# Patient Record
Sex: Male | Born: 1940 | Race: White | Hispanic: No | Marital: Married | Smoking: Current some day smoker
Health system: Southern US, Community
[De-identification: ages and names within clinical notes are randomized; demographics above are authoritative.]

## PROBLEM LIST (undated history)

## (undated) DIAGNOSIS — I214 Non-ST elevation (NSTEMI) myocardial infarction: Secondary | ICD-10-CM

## (undated) DIAGNOSIS — I5022 Chronic systolic (congestive) heart failure: Secondary | ICD-10-CM

## (undated) DIAGNOSIS — K219 Gastro-esophageal reflux disease without esophagitis: Secondary | ICD-10-CM

## (undated) DIAGNOSIS — I255 Ischemic cardiomyopathy: Secondary | ICD-10-CM

## (undated) DIAGNOSIS — I2510 Atherosclerotic heart disease of native coronary artery without angina pectoris: Secondary | ICD-10-CM

## (undated) DIAGNOSIS — K5790 Diverticulosis of intestine, part unspecified, without perforation or abscess without bleeding: Secondary | ICD-10-CM

## (undated) DIAGNOSIS — I10 Essential (primary) hypertension: Secondary | ICD-10-CM

## (undated) DIAGNOSIS — E785 Hyperlipidemia, unspecified: Secondary | ICD-10-CM

## (undated) DIAGNOSIS — I441 Atrioventricular block, second degree: Secondary | ICD-10-CM

## (undated) DIAGNOSIS — J449 Chronic obstructive pulmonary disease, unspecified: Secondary | ICD-10-CM

## (undated) DIAGNOSIS — E669 Obesity, unspecified: Secondary | ICD-10-CM

## (undated) DIAGNOSIS — G4733 Obstructive sleep apnea (adult) (pediatric): Secondary | ICD-10-CM

## (undated) DIAGNOSIS — I4892 Unspecified atrial flutter: Secondary | ICD-10-CM

## (undated) DIAGNOSIS — M1990 Unspecified osteoarthritis, unspecified site: Secondary | ICD-10-CM

## (undated) HISTORY — DX: Chronic systolic (congestive) heart failure: I50.22

## (undated) HISTORY — PX: APPENDECTOMY: SHX54

## (undated) HISTORY — PX: TONSILLECTOMY: SUR1361

## (undated) HISTORY — PX: KNEE ARTHROSCOPY: SUR90

## (undated) HISTORY — DX: Unspecified atrial flutter: I48.92

## (undated) HISTORY — DX: Ischemic cardiomyopathy: I25.5

---

## 2003-11-13 ENCOUNTER — Encounter: Admission: RE | Admit: 2003-11-13 | Discharge: 2003-11-13 | Payer: Self-pay | Admitting: Family Medicine

## 2004-05-02 ENCOUNTER — Ambulatory Visit (HOSPITAL_COMMUNITY): Admission: RE | Admit: 2004-05-02 | Discharge: 2004-05-02 | Payer: Self-pay | Admitting: Sports Medicine

## 2004-11-29 ENCOUNTER — Ambulatory Visit: Payer: Self-pay | Admitting: Internal Medicine

## 2004-12-05 ENCOUNTER — Ambulatory Visit: Payer: Self-pay | Admitting: Cardiology

## 2006-12-28 ENCOUNTER — Ambulatory Visit: Payer: Self-pay | Admitting: Internal Medicine

## 2007-01-09 ENCOUNTER — Encounter: Payer: Self-pay | Admitting: Internal Medicine

## 2007-01-09 ENCOUNTER — Ambulatory Visit: Payer: Self-pay | Admitting: Internal Medicine

## 2007-01-09 DIAGNOSIS — D126 Benign neoplasm of colon, unspecified: Secondary | ICD-10-CM | POA: Insufficient documentation

## 2007-01-15 ENCOUNTER — Ambulatory Visit: Payer: Self-pay | Admitting: Internal Medicine

## 2007-01-15 LAB — CONVERTED CEMR LAB
HCT: 43.3 % (ref 39.0–52.0)
Hemoglobin: 14.8 g/dL (ref 13.0–17.0)
Monocytes Relative: 10.1 % (ref 3.0–11.0)
WBC: 6.6 10*3/uL (ref 4.5–10.5)

## 2007-01-16 ENCOUNTER — Ambulatory Visit: Payer: Self-pay | Admitting: Internal Medicine

## 2007-05-16 DIAGNOSIS — I214 Non-ST elevation (NSTEMI) myocardial infarction: Secondary | ICD-10-CM

## 2007-05-16 HISTORY — DX: Non-ST elevation (NSTEMI) myocardial infarction: I21.4

## 2007-08-08 DIAGNOSIS — K648 Other hemorrhoids: Secondary | ICD-10-CM | POA: Insufficient documentation

## 2007-08-08 DIAGNOSIS — K5730 Diverticulosis of large intestine without perforation or abscess without bleeding: Secondary | ICD-10-CM | POA: Insufficient documentation

## 2007-11-05 ENCOUNTER — Ambulatory Visit: Payer: Self-pay | Admitting: Cardiovascular Disease

## 2007-11-05 ENCOUNTER — Inpatient Hospital Stay (HOSPITAL_COMMUNITY): Admission: EM | Admit: 2007-11-05 | Discharge: 2007-11-07 | Payer: Self-pay | Admitting: Emergency Medicine

## 2007-11-06 ENCOUNTER — Encounter: Payer: Self-pay | Admitting: Cardiovascular Disease

## 2007-12-05 ENCOUNTER — Encounter (HOSPITAL_COMMUNITY): Admission: RE | Admit: 2007-12-05 | Discharge: 2008-03-04 | Payer: Self-pay | Admitting: Cardiovascular Disease

## 2007-12-09 ENCOUNTER — Ambulatory Visit: Payer: Self-pay | Admitting: Cardiovascular Disease

## 2007-12-09 ENCOUNTER — Ambulatory Visit: Payer: Self-pay | Admitting: Cardiology

## 2008-01-07 ENCOUNTER — Ambulatory Visit: Payer: Self-pay | Admitting: Cardiovascular Disease

## 2008-01-07 ENCOUNTER — Ambulatory Visit: Payer: Self-pay

## 2008-01-09 ENCOUNTER — Ambulatory Visit: Payer: Self-pay

## 2008-01-09 ENCOUNTER — Ambulatory Visit: Payer: Self-pay | Admitting: Cardiovascular Disease

## 2008-03-04 ENCOUNTER — Ambulatory Visit: Payer: Self-pay | Admitting: Cardiology

## 2008-06-15 DIAGNOSIS — E663 Overweight: Secondary | ICD-10-CM | POA: Insufficient documentation

## 2008-06-15 DIAGNOSIS — M1710 Unilateral primary osteoarthritis, unspecified knee: Secondary | ICD-10-CM

## 2008-06-15 DIAGNOSIS — E785 Hyperlipidemia, unspecified: Secondary | ICD-10-CM | POA: Insufficient documentation

## 2008-06-15 DIAGNOSIS — I10 Essential (primary) hypertension: Secondary | ICD-10-CM | POA: Insufficient documentation

## 2008-06-16 ENCOUNTER — Ambulatory Visit: Payer: Self-pay | Admitting: Cardiovascular Disease

## 2008-06-24 ENCOUNTER — Ambulatory Visit: Payer: Self-pay | Admitting: Cardiology

## 2008-08-19 ENCOUNTER — Encounter: Payer: Self-pay | Admitting: Cardiovascular Disease

## 2008-08-19 ENCOUNTER — Ambulatory Visit: Payer: Self-pay | Admitting: Cardiovascular Disease

## 2008-08-19 DIAGNOSIS — J209 Acute bronchitis, unspecified: Secondary | ICD-10-CM | POA: Insufficient documentation

## 2008-11-04 ENCOUNTER — Ambulatory Visit: Payer: Self-pay | Admitting: Cardiology

## 2008-12-17 ENCOUNTER — Encounter: Payer: Self-pay | Admitting: Cardiovascular Disease

## 2008-12-29 ENCOUNTER — Telehealth: Payer: Self-pay | Admitting: Cardiovascular Disease

## 2008-12-30 ENCOUNTER — Encounter: Payer: Self-pay | Admitting: *Deleted

## 2008-12-30 ENCOUNTER — Telehealth (INDEPENDENT_AMBULATORY_CARE_PROVIDER_SITE_OTHER): Payer: Self-pay | Admitting: *Deleted

## 2008-12-31 ENCOUNTER — Telehealth (INDEPENDENT_AMBULATORY_CARE_PROVIDER_SITE_OTHER): Payer: Self-pay

## 2008-12-31 ENCOUNTER — Encounter: Payer: Self-pay | Admitting: Cardiovascular Disease

## 2008-12-31 ENCOUNTER — Ambulatory Visit: Payer: Self-pay

## 2009-01-07 ENCOUNTER — Ambulatory Visit: Payer: Self-pay | Admitting: Cardiovascular Disease

## 2009-01-07 LAB — CONVERTED CEMR LAB
BUN: 11 mg/dL (ref 6–23)
CO2: 32 meq/L (ref 19–32)
Calcium: 8.6 mg/dL (ref 8.4–10.5)
Creatinine, Ser: 0.9 mg/dL (ref 0.4–1.5)
GFR calc non Af Amer: 88.73 mL/min (ref >60)
Glucose, Bld: 102 mg/dL — ABNORMAL HIGH (ref 70–99)
Potassium: 4.2 meq/L (ref 3.5–5.1)

## 2009-01-12 ENCOUNTER — Ambulatory Visit: Payer: Self-pay | Admitting: Cardiovascular Disease

## 2009-04-16 ENCOUNTER — Encounter (INDEPENDENT_AMBULATORY_CARE_PROVIDER_SITE_OTHER): Payer: Self-pay | Admitting: *Deleted

## 2009-04-30 ENCOUNTER — Ambulatory Visit: Payer: Self-pay | Admitting: Cardiology

## 2009-04-30 ENCOUNTER — Encounter: Payer: Self-pay | Admitting: Cardiovascular Disease

## 2009-06-09 ENCOUNTER — Ambulatory Visit: Payer: Self-pay | Admitting: Cardiology

## 2009-06-22 ENCOUNTER — Ambulatory Visit: Payer: Self-pay | Admitting: Cardiology

## 2009-07-06 ENCOUNTER — Ambulatory Visit: Payer: Self-pay | Admitting: Cardiovascular Disease

## 2009-08-10 ENCOUNTER — Encounter (INDEPENDENT_AMBULATORY_CARE_PROVIDER_SITE_OTHER): Payer: Self-pay | Admitting: *Deleted

## 2009-08-24 ENCOUNTER — Ambulatory Visit: Payer: Self-pay | Admitting: Cardiovascular Disease

## 2009-08-24 ENCOUNTER — Encounter: Payer: Self-pay | Admitting: Cardiovascular Disease

## 2009-08-26 ENCOUNTER — Ambulatory Visit: Payer: Self-pay | Admitting: Cardiovascular Disease

## 2009-08-26 ENCOUNTER — Encounter (INDEPENDENT_AMBULATORY_CARE_PROVIDER_SITE_OTHER): Payer: Self-pay | Admitting: *Deleted

## 2009-08-26 ENCOUNTER — Telehealth (INDEPENDENT_AMBULATORY_CARE_PROVIDER_SITE_OTHER): Payer: Self-pay | Admitting: *Deleted

## 2009-09-29 ENCOUNTER — Encounter: Payer: Self-pay | Admitting: Cardiovascular Disease

## 2009-09-29 ENCOUNTER — Ambulatory Visit: Payer: Self-pay | Admitting: Cardiovascular Disease

## 2009-10-14 ENCOUNTER — Encounter: Payer: Self-pay | Admitting: Cardiovascular Disease

## 2009-11-01 ENCOUNTER — Encounter (INDEPENDENT_AMBULATORY_CARE_PROVIDER_SITE_OTHER): Payer: Self-pay | Admitting: *Deleted

## 2009-11-25 ENCOUNTER — Encounter: Payer: Self-pay | Admitting: Internal Medicine

## 2009-12-14 ENCOUNTER — Ambulatory Visit: Payer: Self-pay | Admitting: Internal Medicine

## 2009-12-14 DIAGNOSIS — R0602 Shortness of breath: Secondary | ICD-10-CM

## 2009-12-14 DIAGNOSIS — G4730 Sleep apnea, unspecified: Secondary | ICD-10-CM | POA: Insufficient documentation

## 2009-12-14 DIAGNOSIS — R05 Cough: Secondary | ICD-10-CM

## 2009-12-21 ENCOUNTER — Encounter: Payer: Self-pay | Admitting: Cardiovascular Disease

## 2009-12-30 ENCOUNTER — Ambulatory Visit: Payer: Self-pay | Admitting: Internal Medicine

## 2010-01-06 ENCOUNTER — Ambulatory Visit: Payer: Self-pay | Admitting: Cardiovascular Disease

## 2010-03-02 ENCOUNTER — Encounter: Payer: Self-pay | Admitting: Internal Medicine

## 2010-03-02 ENCOUNTER — Ambulatory Visit: Payer: Self-pay | Admitting: Internal Medicine

## 2010-03-02 DIAGNOSIS — J449 Chronic obstructive pulmonary disease, unspecified: Secondary | ICD-10-CM | POA: Insufficient documentation

## 2010-03-02 DIAGNOSIS — J4489 Other specified chronic obstructive pulmonary disease: Secondary | ICD-10-CM | POA: Insufficient documentation

## 2010-03-31 ENCOUNTER — Encounter: Payer: Self-pay | Admitting: Cardiovascular Disease

## 2010-03-31 ENCOUNTER — Encounter: Payer: Self-pay | Admitting: Internal Medicine

## 2010-05-02 ENCOUNTER — Encounter: Payer: Self-pay | Admitting: Internal Medicine

## 2010-05-02 ENCOUNTER — Ambulatory Visit: Payer: Self-pay | Admitting: Internal Medicine

## 2010-06-14 NOTE — Letter (Signed)
Summary: [facility]  [facility]   Imported By: Lester [place] Rose 12/20/2009 09:42:04  _____________________________________________________________________  External Attachment:    Type:   Image     Comment:   External Document

## 2010-06-14 NOTE — Assessment & Plan Note (Signed)
Summary: pulm cons/klw   Visit Type:  Initial Consult Copy to:  Dr. Felipa Eth Primary Provider/Referring Provider:  Dr. Felipa Eth - Primary, Dr. Eden Emms - Cards,   CC:  Pulmnary Consult for SOB.  History of Present Illness: IOV 2010-01-05  70 year old pipe-smoker, BMI 38, known CAD - s/p stent in 2009. PResents to pulmonary office with c/o dyspnea. Also c/o mucus in throat.   #dyspnea - insidious onset 6-8 months ago. Denies dyspnea prior to that even during MI period except briefly. Stabe since onset. Rates dyspnea is as severe. Dyspnea aggravated by 'mucus accumulation in throat' and when he is in his condo.  Dyspnea was relieved by one dose of steroids in Dr. Vicente Males office recently. Sporadic use  of samples of symbicort seemed to have helped recently. Proventil inhaler appears to have helped "a little bit". Also, he states that a visit to beaches in June 2011 and [place], [place] in July  helped with dyspnea and . He is concerned about familial issues with dyspna becuase both his dad and grandad had 'lung problems'.  Dyspnea is associated with chest tightness and occ wheeze. STress test 1 year ago per his hx was normal although I cannot find it in [employer].   #Cough - also reports a cough. It is associated with mucus in throat. PResent for 1 year. Mucus is usually clear but very thick. He feels it is a post nasal drainage. He does not think mucus is coming from chest.   Denies chest pain,  hemoptysis, edema, orthopnea, paroxysmal nocturnal dyspnea, fever, chills  Preventive Screening-Counseling & Management  Alcohol-Tobacco     Smoking Status: quit < 6 months     Year Started: 1961     Year Quit: 2011  Comments: continuous pipe smoker x 40 years. Quit 2011.   Current Medications (verified): 1)  Metoprolol Tartrate 25 Mg Tabs (Metoprolol Tartrate) .... Take One Tablet By Mouth Twice A Day 2)  Multivitamins   Tabs (Multiple Vitamin) .Marland Kitchen.. 1 Tab By Mouth Once Daily 3)  Co Q-10 75 Mg Caps  (Coenzyme Q10) .Marland Kitchen.. 1 Tab By Mouth Once Daily 4)  Vitamin D 2000 Unit Tabs (Cholecalciferol) .Marland Kitchen.. 1 Tab By Mouth Once Daily 5)  Crestor 10 Mg Tabs (Rosuvastatin Calcium) .... At Bedtime 6)  Aspirin 81 Mg Chw Tab (Aspirin) .... Take One (1) Tablet By Mouth Daily  Allergies (verified): No Known Drug Allergies  Past History:  Past Surgical History: Last updated: 06/15/2008 Appendectomy Tonsillectomy Right knee surgery heart cath 11/06/07 s/p drug eluting stent to the left ant. descending 6/09  Family History: Last updated: 2010/01/05 Father:decease, allergies and asthma Grandfather: died of lung problems Mother:decease Siblings:sister diabetes  Social History: Last updated: 01-05-2010 Retired self employed running greenhouse Married Drug Use - no 2 children Tobacco Use - Former pipe smoker x 40 yrs.  Quit in 2011. 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  Quit in 2011. alcohol 3-6 drinks/month Smoking Status:  quit < 6 months  Review of Systems       The patient complains of shortness of breath with activity, productive cough, non-productive cough, difficulty swallowing, sore throat, tooth/dental problems, nasal congestion/difficulty breathing through nose, hand/feet swelling, and joint stiffness or pain.  The patient denies shortness of breath at rest, chest  pain, irregular heartbeats, acid heartburn, indigestion, loss of appetite, weight change, abdominal pain, headaches, sneezing, itching, ear ache, anxiety, depression, rash, change in color of mucus, and fever.    Vital Signs:  Patient profile:   70 year old male Height:      74 inches Weight:      295.25 pounds BMI:     38.04 O2 Sat:      93 % on Room air Temp:     95.9 degrees F oral Pulse rate:   68 / minute BP sitting:   146 / 88  (left arm) Cuff size:   large  Vitals Entered By: Gweneth Dimitri RN (December 14, 2009 3:31 PM)  O2 Flow:  Room air  Serial Vital Signs/Assessments:  Comments: Ambulatory Pulse Oximetry  Resting; HR_55____    02 Sat_____  Lap1 (185 feet)   HR_72____   02 Sat__95%RA___ Lap2 (185 feet)   HR_75____   02 Sat__93%RA___    Lap3 (185 feet)   HR_74____   02 Sat__95%RA___  _X__Test Completed without Difficulty ___Test Stopped due to:  Carver Fila  December 14, 2009 4:36 PM  By: Carver Fila   CC: Pulmnary Consult for SOB Comments Medications reviewed with patient Daytime contact number verified with patient. Gweneth Dimitri RN  December 14, 2009 3:31 PM    Physical Exam  General:  GEN: Obese Affect appropriate Healthy:  appears stated age HEENT: normal Neck supple with no adenopathy JVP normal no bruits no thyromegaly Lungs clear with no wheezing and good diaphragmatic motion Heart:  S1/S2 no murmur,rub, gallop or click PMI normal Abdomen: benighn, BS positve, no tenderness, no AAA no bruit.  No HSM or HJR Distal pulses intact with no bruits No edema Neuro non-focal Skin warm and dry    CXR  Procedure date:  11/05/2007  Findings:      Service Report Text  [facility] Accession Number: [id number]      Clinical Data: Chest pain.    CHEST - 2 VIEW    Comparison: None    Findings: The heart is borderline in size.  There is a left   ventricular configuration.  Low lung volumes with vascular crowding   and streaky atelectasis.  Some of this  may be chronic scarring   change.  No infiltrates, edema or effusions.  Mild peribronchial   thickening is noted.  This could be related to smoking.  The bony   thorax is intact.    IMPRESSION:    1.  Low volume chest film with vascular crowding and streaky   atelectasis.   2.  Bronchitic changes, likely related to smoking.   3.  No infiltrates, edema or effusions.    Read By:  Cyndie Chime,  M.D.   Released By:  Cyndie Chime,  M.D.  Comments:      independently reviewed  Echocardiogram  Procedure date:  11/06/2007  Findings:      SUMMARY   -  The left ventricle was small. Overall left ventricular systolic         function was normal. Left ventricular ejection fraction was  estimated to be 55 %. Left ventricular wall thickness was         mildly to moderately increased.   -  The aortic valve was mildly calcified.    IMPRESSIONS   -  There was no echocardiographic evidence for a cardiac source of         embolism.   Impression & Recommendations:  Problem # 1:  DYSPNEA (ICD-786.05) Assessment New Need to rule out COPD. Underlying Obesity and CAD might be playing a role but first will get PFT and decide next step Orders: Pulmonary Referral (Pulmonary) Consultation Level V (45409)  Problem # 2:  COUGH (ICD-786.2) Assessment: New Probably due to sinus drainage. Associated chronic bronchitis/copd from smoking might also be playing a role. WE will start with netti pot saline wash onregular basis and see how this helps him.  Orders: Pulmonary Referral (Pulmonary) Consultation Level V [phone])  Medications Added to Medication List This Visit: 1)  Crestor 10 Mg Tabs (Rosuvastatin calcium) .... At bedtime  Patient Instructions: 1)  use netti pot saline wash daily 2)  my nurse will walk you to test for oxygen 3)  have full breathing test called PFT and return for followup   Immunization History:  Pneumovax Immunization History:    Pneumovax:   historical (05/15/2008)

## 2010-06-14 NOTE — Assessment & Plan Note (Signed)
Summary: rov 2 month///kp   Visit Type:  Follow-up Copy to:  Dr. Felipa Eth Primary Provider/Referring Provider:  Dr. Felipa Eth - Primary, Dr. Eden Emms - Cards,   CC:  Pt here for followup. Pt states breathing has been doing well but he has recently been feeling some increased congestion and PND. Marland Kitchen  History of Present Illness: IOV 12/14/2009  70 year old ex- pipe-smoker, BMI 38, known CAD - s/p stent in 2009. PResents to pulmonary office with c/o dyspnea. Also c/o mucus in throat.   #dyspnea - insidious onset 6-8 months ago. Denies dyspnea prior to that even during MI period except briefly. Stabe since onset. Rates dyspnea is as severe. Dyspnea aggravated by 'mucus accumulation in throat' and when he is in his condo.  Dyspnea was relieved by one dose of steroids in Dr. Vicente Males office recently. Sporadic use  of samples of symbicort seemed to have helped recently. Proventil inhaler appears to have helped "a little bit". Also, he states that a visit to beaches in June 2011 and [place], [place] in July  helped with dyspnea and . He is concerned about familial issues with dyspna becuase both his dad and grandad had 'lung problems'.  Dyspnea is associated with chest tightness and occ wheeze. STress test 1 year ago per his hx was normal although I cannot find it in [employer].   #Cough - also reports a cough. It is associated with mucus in throat. PResent for 1 year. Mucus is usually clear but very thick. He feels it is a post nasal drainage. He does not think mucus is coming from chest.   REC: 1. PFT  2. Walking desat test -> Did not desaturate 3. Netti Pot saline wash    OV December 30, 2009: some relief with netti pot saline wash. Otherwise no change in symptoms since last visit. No new symptoms. Here to review PFT results. PFTs today show Moderate Obstruction (Fev1 1.91L/58%,  wtih 320cc/10%  BD response in FVC, normal lung volume TLC  6.6/88%, and normal DLCO 24/80%. This fits in with asthma. REC: START  SYMBICORT and use NETTI POT REGULARLY.   March 02, 2010:Dyspnea somewhat better after starting symbicort. Past week sinus congestion +, sick contacts +. No wheeze. No coug. No  sputum. Does not want antibiotics. No fever. No edema. No sputum. No syncope. Cleda Daub today shows fev1 1.96L/51% and unchanged from last visit despite reported compliance with symbicort. REfusing flu shot   Preventive Screening-Counseling & Management  Alcohol-Tobacco     Smoking Status: quit < 6 months     Smoke Cessation Stage: quit     Year Started: 1961     Year Quit: 2011     Tobacco Counseling: not to resume use of tobacco products  Current Medications (verified): 1)  Metoprolol Tartrate 25 Mg Tabs (Metoprolol Tartrate) .... Take One Tablet By Mouth Twice A Day 2)  Multivitamins   Tabs (Multiple Vitamin) .Marland Kitchen.. 1 Tab By Mouth Once Daily 3)  Co Q-10 75 Mg Caps (Coenzyme Q10) .Marland Kitchen.. 1 Tab By Mouth Once Daily 4)  Vitamin D 2000 Unit Tabs (Cholecalciferol) .Marland Kitchen.. 1 Tab By Mouth Once Daily 5)  Crestor 10 Mg Tabs (Rosuvastatin Calcium) .... At Bedtime 6)  Aspirin 81 Mg Chw Tab (Aspirin) .... Take One (1) Tablet By Mouth Daily 7)  Symbicort 160-4.5 Mcg/act Aero (Budesonide-Formoterol Fumarate) .... 2 Puffs Twicw Daily 8)  Proair Hfa 108 (90 Base) Mcg/act Aers (Albuterol Sulfate) .... As Needed  Allergies (verified): No Known Drug  Allergies  Past History:  Past medical, surgical, family and social histories (including risk factors) reviewed, and no changes noted (except as noted below).  Past Medical History: Reviewed history from 12/14/2009 and no changes required. Current Problems:  HEMORRHOIDS, INTERNAL (ICD-455.0) DIVERTICULOSIS, COLON (ICD-562.10) COLONIC POLYPS, ADENOMATOUS (ICD-211.3) CAD  >PCI of the lesion in the proximal LAD using a PROMUS         drug-eluting stent 6.24.09 Diabetes Type 2 Hyperlipidemia Hypertension Asthma  Past Surgical History: Reviewed history from 06/15/2008 and no changes  required. Appendectomy Tonsillectomy Right knee surgery heart cath 11/06/07 s/p drug eluting stent to the left ant. descending 6/09  Family History: Reviewed history from 12/14/2009 and no changes required. Father:decease, allergies and asthma Grandfather: died of lung problems Mother:decease Siblings:sister diabetes  Social History: Reviewed history from 12/14/2009 and no changes required. Retired self employed [employer] Married Drug Use - no 2 children Tobacco Use - Former pipe smoker x 40 yrs.  Quit in 2011. alcohol 3-6 drinks/month  Review of Systems       The patient complains of shortness of breath with activity, productive cough, and nasal congestion/difficulty breathing through nose.  The patient denies shortness of breath at rest, non-productive cough, coughing up blood, chest pain, irregular heartbeats, acid heartburn, indigestion, loss of appetite, weight change, abdominal pain, difficulty swallowing, sore throat, tooth/dental problems, headaches, sneezing, itching, ear ache, anxiety, depression, hand/feet swelling, joint stiffness or pain, rash, change in color of mucus, and fever.    Vital Signs:  Patient profile:   70 year old male Height:      74 inches Weight:      305.50 pounds BMI:     39.37 O2 Sat:      88 % on Room air Temp:     98.2 degrees F oral Pulse rate:   86 / minute BP sitting:   128 / 76  (right arm) Cuff size:   large  Vitals Entered By: Carron Curie CMA (March 02, 2010 3:32 PM)  O2 Flow:  Room air CC: Pt here for followup. Pt states breathing has been doing well but he has recently been feeling some increased congestion and PND.  Comments Medications reviewed with patient Carron Curie CMA  March 02, 2010 3:35 PM Daytime phone number verified with patient.    Physical Exam  General:  GEN: Obese Affect appropriate Healthy:  appears stated age HEENT: normal Neck supple with no adenopathy JVP normal no bruits no  thyromegaly Lungs clear with no wheezing and good diaphragmatic motion Heart:  S1/S2 no murmur,rub, gallop or click PMI normal Abdomen: benighn, BS positve, no tenderness, no AAA no bruit.  No HSM or HJR Distal pulses intact with no bruits No edema Neuro non-focal Skin warm and dry  Msk:  STands leaning forward and holding rail or bed - baseline   Impression & Recommendations:  Problem # 1:  DYSPNEA (ICD-786.05) Assessment Unchanged subjectively dyspnea better after starting symbicort but objectively fev1 is unchanged. HE insists he is compliant with symbicort. I am revising diagnosis from asthma to copd. Will add spiriva. Refer to rehab. Encouraged to lose weight. Offered flu shot but he refused   Regarding acute sins congestion  he wants to watch it. Refused antibiotics  Orders: Rehabilitation Referral (Rehab) Est. Patient Level III (54098) Prescription Created Electronically [phone])  Medications Added to Medication List This Visit: 1)  Symbicort 160-4.5 Mcg/act Aero (Budesonide-formoterol fumarate) .... 2 puffs twicw daily 2)  Proair Hfa 108 (90 Base) Mcg/act  Aers (Albuterol sulfate) .... As needed 3)  Spiriva Handihaler 18 Mcg Caps (Tiotropium bromide monohydrate) .... Onepuffs in handihaler daily  Patient Instructions: 1)  your breathing test is no better 2)  so I think you have copd instead of asthma 3)  continue symbicort 2 puff two times a day 4)  start spiriva 1 puff daily - take 2 samples and learn technique 5)  return in 2 months  6)  spirometry at return 7)  i am referring you to pulmonary rehab 8)  quit smoking 9)  if sinus gets worse, call us for antibiotics or make a visit Prescriptions: SPIRIVA HANDIHALER 18 MCG  CAPS (TIOTROPIUM BROMIDE MONOHYDRATE) onepuffs in handihaler daily  #1 x 6   Entered and Authorized by:   Kalman Shan MD   Signed by:   Kalman Shan MD on 03/02/2010   Method used:   Electronically to        [facility] [address]  [phone]* (retail)       [address]       [place], [place]  [address]       Ph: [phone] or [phone]       Fax: [phone]   RxID:   [id number]

## 2010-06-14 NOTE — Miscellaneous (Signed)
  Clinical Lists Changes  Observations: Added new observation of RS STUDY: [employer] Study (08/26/2009 18:32) Added new observation of RESEARCHCAND: Cardiology (08/26/2009 18:32)      Research Study Name: [employer]

## 2010-06-14 NOTE — Assessment & Plan Note (Signed)
Summary: F6M/DM      Allergies Added: NKDA  Referring Provider:  Dr. Felipa Eth Primary Provider:  Dr. Felipa Eth - Primary, Dr. Eden Emms - Cards,    History of Present Illness: [name] is seen today for F/U of his HTN, cholesterol and CAD.  He had a DES to the LAD in 2008.  I reviewed his myovue from 12/2008 and it was normal.  I would like to change his statin to Crestor since there are reports of increased LFT abnormalityat high dose.   However [facility] 40[facility]  . He wanted to come off his Plavix which should be fine since he is 2 years out from his DES.  He is on Tracer study drug with no bleeding diathesis.  He is not having SSCP, palpitations.  He does have allergies and bronchitis.  He is anxious to consider red yeast rice intead of a statin.  He is tolerating crestor taking 1/2 of a 10mg  tablet.  Dr Virgel Manifold checked his chol 3 weeks ago and we will have to get these results.    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 Take One Tablet By Mouth Twice A Day 2)  Multivitamins   Tabs (Multiple Vitamin) .Marland Kitchen.. 1 Tab By Mouth Once Daily 3)  Co Q-10 75 Mg Caps (Coenzyme Q10) .Marland Kitchen.. 1 Tab By Mouth Once Daily 4)  Vitamin D 2000 Unit Tabs (Cholecalciferol) .Marland Kitchen.. 1 Tab By Mouth Once Daily 5)  Crestor 10 Mg Tabs (Rosuvastatin Calcium) .... At Bedtime 6)  Aspirin 81 Mg Chw Tab (Aspirin) .... Take One (1) Tablet By Mouth Daily 7)  Symbicort 80-4.5 Mcg/act  Aero  (Budesonide-Formoterol Fumarate) .... Two Puffs Twice Daily 8)  Proair Hfa 108 (90 Base) Mcg/act  Aers (Albuterol Sulfate) .Marland Kitchen.. 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 descending 6/09  Family History: Last updated: December 29, 2009 Father:decease, allergies and asthma Grandfather: died of lung problems Mother:decease Siblings:sister diabetes  Social History: Last updated: 2009-12-29 Retired self employed running greenhouse Married Drug Use - no 2 children Tobacco Use - Former pipe smoker x 40 yrs.  Quit in 2011. alcohol 3-6 drinks/month  Review of Systems       Denies fever, malais, weight loss, blurry vision, decreased visual acuity, cough, sputum, SOB, hemoptysis, pleuritic pain, palpitaitons, heartburn, abdominal pain, melena, lower positive bilateral lower extremity edema, no  claudication, or rash.   Vital Signs:  Patient profile:   70 year old male Height:      74 inches Weight:  299 pounds BMI:     38.53 Pulse rate:   70 / minute Resp:     14 per minute BP sitting:   120 / 80  (left arm)  Vitals Entered By: Kem Parkinson (January 06, 2010 2:17 PM)  Physical Exam  General:  Affect appropriate Healthy:  appears stated age HEENT: normal Neck supple with no adenopathy JVP normal no bruits no thyromegaly Lungs clear with no wheezing and good diaphragmatic motion Heart:  S1/S2 no murmur,rub, gallop or click PMI  normal Abdomen: benighn, BS positve, no tenderness, no AAA no bruit.  No HSM or HJR Distal pulses intact with no bruits Bilateral varicosities and chronic dependant edema Neuro non-focal Skin warm and dry    Impression & Recommendations:  Problem # 1:  DYSPNEA (ICD-786.05) F/U pulm.  Cotninue CPAP  Weight loss discussed His updated medication list for this problem includes:    Metoprolol Tartrate 25 Mg Tabs (Metoprolol tartrate) .Marland Kitchen... Take one tablet by mouth twice a day    Aspirin 81 Mg Chw Tab (Aspirin) .Marland Kitchen... Take one (1) tablet by mouth daily  Problem # 2:  HEART ATTACK (ICD-410.90) CAD stable.  non ischemic myovue 2010.  Continue ASA and BB His updated medication list for this problem includes:    Metoprolol Tartrate 25 Mg Tabs (Metoprolol tartrate) .Marland Kitchen... Take one tablet by mouth twice a day    Aspirin 81 Mg Chw Tab (Aspirin) .Marland Kitchen... Take one (1) tablet by mouth daily  Problem # 3:  HYPERTENSION, BORDERLINE (ICD-401.9) Well controlled His updated medication list for this problem includes:    Metoprolol Tartrate 25 Mg Tabs (Metoprolol tartrate) .Marland Kitchen... Take one tablet by mouth twice a day    Aspirin 81 Mg Chw Tab (Aspirin) .Marland Kitchen... Take one (1) tablet by mouth daily  Problem # 4:  HYPERLIPIDEMIA-MIXED (ICD-272.4) Review labs from [facility] but likely continue low dose crestor His updated medication list for this problem includes:    Crestor 10 Mg Tabs (Rosuvastatin calcium) .Marland Kitchen... At bedtime  Patient Instructions: 1)  Your physician recommends that you schedule a follow-up appointment in: 6 MONTHS

## 2010-06-14 NOTE — Assessment & Plan Note (Signed)
Summary: F6M/DM  Medications Added PRAVASTATIN SODIUM 40 MG TABS (PRAVASTATIN SODIUM) 1 tab by mouth once daily      Allergies Added: NKDA  CC:  pt recentyl fell down steps which caused some pains pt is ok today.  History of Present Illness: [name] is seen today for F/U of his HTN, cholesterol and CAD.  He had a DES to the LAD in 2008.  I reviewed his myovue from 12/2008 and it was normal.  I would like to change his statin to Crestor since there are reports of increased LFT abnormalityat high dose.   However [facility]  . He wanted to come off his Plavix which should be fine since he is 2 years out from his DES.  He is on Tracer study drug with no bleeding diathesis.  He is not having SSCP, palpitations.  He does have allergies and bronchitis.    He indicates his K was a little high He eats a lot of bannanas.  I also suspect his blood was a little hemolyzed.  He continues to be heavy and needs to restrict his carb intake.   Current Problems (verified): 1)  Acute Bronchitis  (ICD-466.0) 2)  Osteoarthritis, Knee, Right, Severe  (ICD-715.96) 3)  Overweight  (ICD-278.02) 4)  Hypertension, Borderline  (ICD-401.9) 5)  Cad  (ICD-414.00) 6)  Hyperlipidemia-mixed  (ICD-272.4) 7)  Hemorrhoids, Internal  (ICD-455.0) 8)  Diverticulosis, Colon  (ICD-562.10) 9)  Colonic Polyps, Adenomatous  (ICD-211.3)  Current Medications (verified): 1)  Aspirin Ec 325 Mg Tbec (Aspirin) .... Take One Tablet By Mouth Daily 2)  Metoprolol Tartrate 25 Mg Tabs (Metoprolol Tartrate) .... Take One Tablet By Mouth Twice A Day 3)  Multivitamins   Tabs (Multiple Vitamin) .Marland Kitchen.. 1 Tab By Mouth Once Daily 4)  Co Q-10 75 Mg Caps (Coenzyme Q10) .Marland Kitchen.. 1 Tab By Mouth Once Daily 5)  Vitamin D 2000 Unit Tabs (Cholecalciferol) .Marland Kitchen.. 1 Tab By Mouth Once Daily 6)  Pravastatin Sodium 40 Mg Tabs (Pravastatin Sodium) .Marland Kitchen.. 1 Tab By Mouth Once Daily  Allergies (verified): No Known Drug Allergies  Past  History:  Past Medical History: Last updated: 06/24/08 Current Problems:  HEMORRHOIDS, INTERNAL (ICD-455.0) DIVERTICULOSIS, COLON (ICD-562.10) COLONIC POLYPS, ADENOMATOUS (ICD-211.3) CAD PCI of the lesion in the proximal LAD using a PROMUS          drug-eluting stent 6.24.09 Diabetes Type 2 Hyperlipidemia Hypertension Asthma  Past Surgical History: Last updated: June 24, 2008 Appendectomy Tonsillectomy Right knee surgery heart cath 11/06/07 s/p drug eluting stent to the left ant. descending 6/09  Family History: Last updated: Jun 24, 2008 Father:decease Mother:decease Siblings:sister diabetes  Social History: Last updated: 06/24/2008 Full Time self employed running greenhouse Married Drug Use - no 2 children Tobacco Use - Former. quit 2006  Review of Systems       Denies fever, malais, weight loss, blurry vision, decreased visual acuity, cough, sputum, SOB, hemoptysis, pleuritic pain, palpitaitons, heartburn, abdominal pain, melena, lower extremity edema, claudication, or rash.   Vital Signs:  Patient profile:   70 year old male Height:      72 inches Weight:      305 pounds BMI:     41.51 Pulse rate:   59 / minute Resp:     14 per minute BP sitting:   118 / 74  (left arm)  Vitals Entered By: Kem Parkinson (July 06, 2009 1:51 PM)  Physical Exam  General:  Affect appropriate Healthy:  appears stated age HEENT: normal Neck supple  with no adenopathy JVP normal no bruits no thyromegaly Lungs clear with no wheezing and good diaphragmatic motion Heart:  S1/S2 no murmur,rub, gallop or click PMI normal Abdomen: benighn, BS positve, no tenderness, no AAA no bruit.  No HSM or HJR Distal pulses intact with no bruits No edema Neuro non-focal Skin warm and dry    Impression & Recommendations:  Problem # 1:  CAD (ICD-414.00) stable no angina non-ischemic myovue in 2010 His updated medication list for this problem includes:    Aspirin Ec 325 Mg Tbec  (Aspirin) .Marland Kitchen... Take one tablet by mouth daily    Metoprolol Tartrate 25 Mg Tabs (Metoprolol tartrate) .Marland Kitchen... Take one tablet by mouth twice a day  Problem # 2:  HYPERLIPIDEMIA-MIXED (ICD-272.4) Blood work in 6-8 weeks at [facility].  F/U on pravachol The following medications were removed from the medication list:    Crestor 10 Mg Tabs (Rosuvastatin calcium) .Marland Kitchen... Take one tablet by mouth daily. His updated medication list for this problem includes:    Pravastatin Sodium 40 Mg Tabs (Pravastatin sodium) .Marland Kitchen... 1 tab by mouth once daily  Problem # 3:  HYPERTENSION, BORDERLINE (ICD-401.9) WEll controlled continue low sodium diet His updated medication list for this problem includes:    Aspirin Ec 325 Mg Tbec (Aspirin) .Marland Kitchen... Take one tablet by mouth daily    Metoprolol Tartrate 25 Mg Tabs (Metoprolol tartrate) .Marland Kitchen... Take one tablet by mouth twice a day  Patient Instructions: 1)  Your physician recommends that you schedule a follow-up appointment in: 6 MONTHS 2)  Your physician recommends that you return for a FASTING lipid profile: WITH [facility] IN 6-8 WEEKS   Echocardiogram Report  Procedure date:  07/06/2009  EKG Report  Procedure date:  07/06/2009  Findings:      NSR 59 PR 234 LAD Septal infarct

## 2010-06-14 NOTE — Letter (Signed)
Summary: Appointment - Reminder 2  [facility], Main Office  [address]   [place], [place] [address]   Phone: [phone]  Fax: [phone]     November 01, 2009 MRN: [id number]   [facility] [address] [place], [place]  [address]   Dear [name],  Our records indicate that it is time to schedule a follow-up appointment with Dr. Eden Emms in August. It is very important that we reach you to schedule this appointment. We look forward to participating in your health care needs. Please contact us at the number listed above at your earliest convenience to schedule your appointment.  If you are unable to make an appointment at this time, give [place] a call so we can update our records.     Sincerely,   Migdalia Dk [facility] Scheduling Team

## 2010-06-14 NOTE — Miscellaneous (Signed)
Summary: Orders Update pft charges  Clinical Lists Changes  Orders: Added new Service order of Carbon Monoxide diffusing w/capacity (94720) - Signed Added new Service order of Lung Volumes (94240) - Signed Added new Service order of Spirometry (Pre & Post) (94060) - Signed 

## 2010-06-14 NOTE — Miscellaneous (Signed)
  Clinical Lists Changes  Observations: Added new observation of RS STUDY: TRACER - study completion 06/08/09 (08/10/2009 11:42)      Research Study Name: TRACER - study completion 06/08/09

## 2010-06-14 NOTE — Progress Notes (Signed)
   Patient was randomized to the [facility] 08/26/09.Pt was given first dose of study medication at 11:30am. Pt called me at 13:30 to tell me was short of breath.Pt stated it was mild shortness of breath.Dr. Eden Emms was notified and told me to bring patient in Friday morning for CXray if patient had not improved by morning. I called patient and asked him to hold study medication until further notice.I notified Study hotline and spoke to Dr. Salli Quarry. He stated that study drug can affect small airway similar to how Adenosine drug acts. I called patient back at 16:30 and patient was feeling better.Patient had taken Mucinex and was able to get secretions up. i told patient if he gets worse to go TO ER. pt verbalized understanding.

## 2010-06-14 NOTE — Assessment & Plan Note (Signed)
Summary: 2-3 weeks/pfts/apc   Copy to:  Dr. Felipa Eth Primary Provider/Referring Provider:  Dr. Felipa Eth - Primary, Dr. Eden Emms - Cards,   CC:  Pt here to review PFT results. .  History of Present Illness: IOV 12/14/2009  70 year old ex- pipe-smoker, BMI 38, known CAD - s/p stent in 2009. PResents to pulmonary office with c/o dyspnea. Also c/o mucus in throat.   #dyspnea - insidious onset 6-8 months ago. Denies dyspnea prior to that even during MI period except briefly. Stabe since onset. Rates dyspnea is as severe. Dyspnea aggravated by 'mucus accumulation in throat' and when he is in his condo.  Dyspnea was relieved by one dose of steroids in Dr. Vicente Males office recently. Sporadic use  of samples of symbicort seemed to have helped recently. Proventil inhaler appears to have helped "a little bit". Also, he states that a visit to beaches in June 2011 and [place], [place] in July  helped with dyspnea and . He is concerned about familial issues with dyspna becuase both his dad and grandad had 'lung problems'.  Dyspnea is associated with chest tightness and occ wheeze. STress test 1 year ago per his hx was normal although I cannot find it in [employer].   #Cough - also reports a cough. It is associated with mucus in throat. PResent for 1 year. Mucus is usually clear but very thick. He feels it is a post nasal drainage. He does not think mucus is coming from chest.   REC: 1. PFT  2. Walking desat test -> Did not desaturate 3. Netti Pot saline wash    OV December 30, 2009: some relief with netti pot saline wash. Otherwise no change in symptoms since last visit. No new symptoms. Here to review PFT results. PFTs today show Moderate Obstruction (Fev1 1.91L/58%,  wtih 320cc/10%  BD response in FVC, normal lung volume TLC  6.6/88%, and normal DLCO 24/80%. This fits in with asthma  Preventive Screening-Counseling & Management  Alcohol-Tobacco     Smoking Status: quit < 6 months     Smoke Cessation Stage:  quit     Year Started: 1961     Year Quit: 2011     Tobacco Counseling: not to resume use of tobacco products  Comments: pipe smoker  Current Medications (verified): 1)  Metoprolol Tartrate 25 Mg Tabs (Metoprolol Tartrate) .... Take One Tablet By Mouth Twice A Day 2)  Multivitamins   Tabs (Multiple Vitamin) .Marland Kitchen.. 1 Tab By Mouth Once Daily 3)  Co Q-10 75 Mg Caps (Coenzyme Q10) .Marland Kitchen.. 1 Tab By Mouth Once Daily 4)  Vitamin D 2000 Unit Tabs (Cholecalciferol) .Marland Kitchen.. 1 Tab By Mouth Once Daily 5)  Crestor 10 Mg Tabs (Rosuvastatin Calcium) .... At Bedtime 6)  Aspirin 81 Mg Chw Tab (Aspirin) .... Take One (1) Tablet By Mouth Daily  Allergies (verified): No Known Drug Allergies  Past History:  Past medical, surgical, family and social histories (including risk factors) reviewed, and no changes noted (except as noted below).  Past Medical History: Reviewed history from 12/14/2009 and no changes required. Current Problems:  HEMORRHOIDS, INTERNAL (ICD-455.0) DIVERTICULOSIS, COLON (ICD-562.10) COLONIC POLYPS, ADENOMATOUS (ICD-211.3) CAD  >PCI of the lesion in the proximal LAD using a PROMUS         drug-eluting stent 6.24.09 Diabetes Type 2 Hyperlipidemia Hypertension Asthma  Past Surgical History: Reviewed history from 06/15/2008 and no changes required. Appendectomy Tonsillectomy Right knee surgery heart cath 11/06/07 s/p drug eluting stent to the left ant. descending  6/09  Family History: Reviewed history from 12/14/2009 and no changes required. Father:decease, allergies and asthma Grandfather: died of lung problems Mother:decease Siblings:sister diabetes  Social History: Reviewed history from 12/14/2009 and no changes required. Retired self employed [employer] Married Drug Use - no 2 children Tobacco Use - Former pipe smoker x 40 yrs.  Quit in 2011. alcohol 3-6 drinks/month  Review of Systems  The patient denies shortness of breath with activity, shortness of  breath at rest, productive cough, non-productive cough, coughing up blood, chest pain, irregular heartbeats, acid heartburn, indigestion, loss of appetite, weight change, abdominal pain, difficulty swallowing, sore throat, tooth/dental problems, headaches, nasal congestion/difficulty breathing through nose, sneezing, itching, ear ache, anxiety, depression, hand/feet swelling, joint stiffness or pain, rash, change in color of mucus, and fever.    Vital Signs:  Patient profile:   70 year old male Height:      74 inches Weight:      299 pounds BMI:     38.53 O2 Sat:      94 % on Room air Temp:     98.1 degrees F oral Pulse rate:   60 / minute BP sitting:   140 / 90  (left arm) Cuff size:   large  Vitals Entered By: Carron Curie CMA (December 30, 2009 9:49 AM)  O2 Flow:  Room air CC: Pt here to review PFT results.  Comments Medications reviewed with patient Carron Curie CMA  December 30, 2009 9:51 AM Daytime phone number verified with patient.    Physical Exam  General:  GEN: Obese Affect appropriate Healthy:  appears stated age HEENT: normal Neck supple with no adenopathy JVP normal no bruits no thyromegaly Lungs clear with no wheezing and good diaphragmatic motion Heart:  S1/S2 no murmur,rub, gallop or click PMI normal Abdomen: benighn, BS positve, no tenderness, no AAA no bruit.  No HSM or HJR Distal pulses intact with no bruits No edema Neuro non-focal Skin warm and dry    MISC. Report  Procedure date:  12/30/2009  Findings:       PFTs today show Moderate Obstruction (Fev1 1.91L/58%,  wtih 320cc/10%  BD response in FVC, normal lung volume TLC  6.6/88%, and normal DLCO 24/80%. This fits in with asthma  Comments:      independently reviewed trace  Impression & Recommendations:  Problem # 1:  DYSPNEA (ICD-786.05) Assessment Unchanged  Underlying Obesity and CAD might be playing a role but PFTs are very suggestive of moderate asthma. Will start symbicort  80/4.5 2 puff two times a day. I explained risks of symbicort from thrush, to long term osteoporosis and cataracts. He understands risks and is willling to proceed. Emphazied need to take it daily regardlss of symptoms. Explained to rinse mouth with listerine after use. MDI technique taught. Will see back in 3 months with spirometry. He will call sooner if there are problems Orders: Pulmonary Referral (Pulmonary)  Orders: Est. Patient Level III ([address]) Prescription Created Electronically [phone]) HFA Instruction [phone])  Problem # 2:  COUGH (ICD-786.2) Assessment: Improved  Probably from sinus drainage and asthma  plan continue netti pot  monitor response with symbicort  Orders: Est. Patient Level III ([address]) Prescription Created Electronically [phone]) HFA Instruction [phone])  Medications Added to Medication List This Visit: 1)  Symbicort 80-4.5 Mcg/act Aero (Budesonide-formoterol fumarate) .... Two puffs twice daily 2)  Proair Hfa 108 (90 Base) Mcg/act Aers (Albuterol sulfate) .Marland Kitchen.. 1-2 puffs every 4-6 hours as needed  Patient Instructions: 1)  your profile  fits asthma 2)  start symbicort 80/4.5 2 puff two times a day without fail 3)  use pro-air 2 puff as needed 4)  we will give you sample and discount card and show you technique 5)  use netti pot daily 6)  do not go back to smoking  7)  return in 2 months 8)  woill check spirometry at fu Prescriptions: PROAIR HFA 108 (90 BASE) MCG/ACT  AERS (ALBUTEROL SULFATE) 1-2 puffs every 4-6 hours as needed  #1 x 6   Entered and Authorized by:   Kalman Shan MD   Signed by:   Kalman Shan MD on 12/30/2009   Method used:   Electronically to        [facility] [address] [phone]* (retail)       [address]       [place], [place]  [address]       Ph: [phone] or [phone]       Fax: [phone]   RxID:   [id number] SYMBICORT 80-4.5 MCG/ACT  AERO (BUDESONIDE-FORMOTEROL FUMARATE) Two puffs twice  daily  #1 x 6   Entered and Authorized by:   Kalman Shan MD   Signed by:   Kalman Shan MD on 12/30/2009   Method used:   Electronically to        [facility] [address] [phone]* (retail)       [address]       [place], [place]  [address]       Ph: [phone] or [phone]       Fax: [phone]   RxID:   [id number]

## 2010-06-16 NOTE — Assessment & Plan Note (Signed)
Summary: rov 2 months ///kp   Visit Type:  Follow-up Copy to:  Dr. Felipa Eth Primary Provider/Referring Provider:  Dr. Felipa Eth - Primary, Dr. Eden Emms - Cards,   CC:  2 month follow-up and pt states he has not used Spiriva due to constipation. Marland Kitchen  History of Present Illness: IOV 12/14/2009  70 year old ex- pipe-smoker, BMI 38, known CAD - s/p stent in 2009. PResents to pulmonary office with c/o dyspnea. Also c/o mucus in throat.   #dyspnea - insidious onset 6-8 months ago. Denies dyspnea prior to that even during MI period except briefly. Stabe since onset. Rates dyspnea is as severe. Dyspnea aggravated by 'mucus accumulation in throat' and when he is in his condo.  Dyspnea was relieved by one dose of steroids in Dr. Vicente Males office recently. Sporadic use  of samples of symbicort seemed to have helped recently. Proventil inhaler appears to have helped "a little bit". Also, he states that a visit to beaches in June 2011 and [place], [place] in July  helped with dyspnea and . He is concerned about familial issues with dyspna becuase both his dad and grandad had 'lung problems'.  Dyspnea is associated with chest tightness and occ wheeze. STress test 1 year ago per his hx was normal although I cannot find it in [employer] [facility].   #Cough - also reports a cough. It is associated with mucus in throat. PResent for 1 year. Mucus is usually clear but very thick. He feels it is a post nasal drainage. He does not think mucus is coming from chest.   REC: 1. PFT  2. Walking desat test -> Did not desaturate 3. Netti Pot saline wash    OV December 30, 2009: some relief with netti pot saline wash. Otherwise no change in symptoms since last visit. No new symptoms. Here to review PFT results. PFTs today show Moderate Obstruction (Fev1 1.91L/58%,  wtih 320cc/10%  BD response in FVC, normal lung volume TLC  6.6/88%, and normal DLCO 24/80%. This fits in with asthma. REC: START SYMBICORT and use NETTI POT REGULARLY.   March 02, 2010:Dyspnea somewhat better after starting symbicort. Past week sinus congestion +, sick contacts +. No wheeze. No coug. No  sputum. Does not want antibiotics. No fever. No edema. No sputum. No syncope. Cleda Daub today shows fev1 1.96L/51% and unchanged from last visit despite reported compliance with symbicort. REfusing flu shot. REC: CONTINUE SYMBICORT, CONTINUE SPIRVA, SPIRO AT RETURN, REFER PULM REHAB, QUIT SMOKING, ROV 2 MONTHS   May 02, 2010: followup COPD/asthma and smoking. Took spiriv but caused constipation and stopped. Now constipation resolved. Dyspnea is stable. No worsening. [place] to beaches and got cold. Now almost over it. Useing netti pot regularly and it helps. Plans to attend pulm rehab in Jan 2012. Cleda Daub today shows fev1 2.2L/58%., RAto 63%. This is imprived from before. Otherwise no complaints   Preventive Screening-Counseling & Management  Alcohol-Tobacco     Smoking Status: quit < 6 months     Smoke Cessation Stage: quit     Year Started: 1961     Year Quit: 2011     Tobacco Counseling: not to resume use of tobacco products  Current Medications (verified): 1)  Metoprolol Tartrate 25 Mg Tabs (Metoprolol Tartrate) .... Take One Tablet By Mouth Twice A Day 2)  Multivitamins   Tabs (Multiple Vitamin) .Marland Kitchen.. 1 Tab By Mouth Once Daily 3)  Co Q-10 75 Mg Caps (Coenzyme Q10) .Marland Kitchen.. 1 Tab By Mouth Once Daily 4)  Vitamin D 2000 Unit Tabs (Cholecalciferol) .Marland Kitchen.. 1 Tab By Mouth Once Daily 5)  Crestor 10 Mg Tabs (Rosuvastatin Calcium) .... At Bedtime 6)  Aspirin 81 Mg Chw Tab (Aspirin) .... Take One (1) Tablet By Mouth Daily 7)  Symbicort 160-4.5 Mcg/act Aero (Budesonide-Formoterol Fumarate) .... 2 Puffs Twicw Daily 8)  Proair Hfa 108 (90 Base) Mcg/act Aers (Albuterol Sulfate) .... As Needed  Allergies (verified): 1)  * Spiriva  Past History:  Past medical, surgical, family and social histories (including risk factors) reviewed, and no changes noted (except as noted  below).  Past Medical History: Current Problems:  HEMORRHOIDS, INTERNAL (ICD-455.0) DIVERTICULOSIS, COLON (ICD-562.10) COLONIC POLYPS, ADENOMATOUS (ICD-211.3) CAD  >PCI of the lesion in the proximal LAD using a PROMUS         drug-eluting stent 6.24.09 Diabetes Type 2 Hyperlipidemia Hypertension Asthma/copd  Past Surgical History: Reviewed history from 06/15/2008 and no changes required. Appendectomy Tonsillectomy Right knee surgery heart cath 11/06/07 s/p drug eluting stent to the left ant. descending 6/09  Family History: Reviewed history from 12/14/2009 and no changes required. Father:decease, allergies and asthma Grandfather: died of lung problems Mother:decease Siblings:sister diabetes  Social History: Reviewed history from 12/14/2009 and no changes required. Retired self employed [employer]ried Drug Use - no 2 children Tobacco Use - Former pipe smoker x 40 yrs.  Quit in 2011. alcohol 3-6 drinks/month  Review of Systems       The patient complains of productive cough.  The patient denies shortness of breath with activity, shortness of breath at rest, non-productive cough, coughing up blood, chest pain, irregular heartbeats, acid heartburn, indigestion, loss of appetite, weight change, abdominal pain, difficulty swallowing, sore throat, tooth/dental problems, headaches, nasal congestion/difficulty breathing through nose, sneezing, itching, ear ache, anxiety, depression, hand/feet swelling, joint stiffness or pain, rash, change in color of mucus, and fever.    Vital Signs:  Patient profile:   70 year old male Height:      74 inches Weight:      305.50 pounds BMI:     39.37 O2 Sat:      90 % on Room air Temp:     98.1 degrees F oral Pulse rate:   71 / minute BP sitting:   114 / 72  (right arm) Cuff size:   large  Vitals Entered By: Carron Curie CMA (May 02, 2010 11:17 AM)  O2 Flow:  Room air CC: 2 month follow-up, pt states he has not  used Spiriva due to constipation.  Comments Medications reviewed with patient Carron Curie CMA  May 02, 2010 11:17 AM Daytime phone number verified with patient.    Physical Exam  General:  GEN: Obese Affect appropriate Healthy:  appears stated age HEENT: normal Neck supple with no adenopathy JVP normal no bruits no thyromegaly Lungs clear with no wheezing and good diaphragmatic motion Heart:  S1/S2 no murmur,rub, gallop or click PMI normal Abdomen: benighn, BS positve, no tenderness, no AAA no bruit.  No HSM or HJR Distal pulses intact with no bruits No edema Neuro non-focal Skin warm and dry  Msk:  STands leaning forward and holding rail or bed - baseline   Impression & Recommendations:  Problem # 1:  COPD (ICD-496) Assessment Unchanged copd with asthma component. STable disease. Intolerant to spiriva (constipation).  REcovered from recent cold.  Cleda Daub is somewhat better since last vist. Advised to continue symbicort and enrol in rehab. Quit smoking as well. Continue netti pot  Other Orders: Est. Patient Level  II [phone])  Patient Instructions: 1)  ok if spiriva not working for you 2)  contnue symbicort for copd 3)  for nasal drainage 4)   - use netti pot but use it iwth bottled/sterile water 5)   - use claritin or benadryl or chlorpheniramine tablet as needed 6)  return in 6 months or sooner if problems

## 2010-06-16 NOTE — Letter (Signed)
Summary: [facility]   Imported By: Lennie Odor 04/27/2010 14:37:46  _____________________________________________________________________  External Attachment:    Type:   Image     Comment:   External Document

## 2010-06-16 NOTE — Letter (Signed)
Summary: Dr Lavonda Jumbo Office Note   Dr Lavonda Jumbo Office Note   Imported By: Lynford Humphrey [place] 04/26/2010 15:01:36  _____________________________________________________________________  External Attachment:    Type:   Image     Comment:   External Document  Appended Document: Dr Lavonda Jumbo Office Note  Candise Bowens, Dr Ezzard Standing from [facility] wants me to see patient for umbilical hernia repair clearance. Please ensure patient is takeing spiriva, symbicort regularly and has started rehab. I will see patient in 1-2 months at patient convenience  Appended Document: Dr Lavonda Jumbo Office Note  pt set to come in on 05-02-10.

## 2010-07-08 ENCOUNTER — Ambulatory Visit (INDEPENDENT_AMBULATORY_CARE_PROVIDER_SITE_OTHER): Payer: Medicare Other | Admitting: Cardiovascular Disease

## 2010-07-08 ENCOUNTER — Encounter: Payer: Self-pay | Admitting: Cardiovascular Disease

## 2010-07-08 DIAGNOSIS — I10 Essential (primary) hypertension: Secondary | ICD-10-CM

## 2010-07-08 DIAGNOSIS — I2510 Atherosclerotic heart disease of native coronary artery without angina pectoris: Secondary | ICD-10-CM

## 2010-07-08 DIAGNOSIS — E7800 Pure hypercholesterolemia, unspecified: Secondary | ICD-10-CM

## 2010-07-12 NOTE — Assessment & Plan Note (Signed)
Summary: F6M/DM/JT unable to confirm appt lmom=mj  Medications Added PRAVASTATIN SODIUM 40 MG TABS (PRAVASTATIN SODIUM) 1/2 tab by mouth once daily LISINOPRIL-HYDROCHLOROTHIAZIDE 10-12.5 MG TABS (LISINOPRIL-HYDROCHLOROTHIAZIDE) 1 tab by mouth once daily      Allergies Added:   Referring Provider:  Dr. Felipa Eth Primary Provider:  Dr. Felipa Eth - Primary, Dr. Eden Emms - Cards,   CC:  check up.  History of Present Illness: [name] is seen today for F/U of his HTN, cholesterol and CAD.  He had a DES to the LAD in 2008.  I reviewed his myovue from 12/2008 and it was normal.  I would like to change his statin to Crestor since there are reports of increased LFT abnormalityat high dose.   However [facility]  . He wanted to come off his Plavix which should be fine since he is 2 years out from his DES.  He is on Tracer study drug with no bleeding diathesis.  He is not having SSCP, palpitations.  He does have allergies and bronchitis.  Recent URI seems to be improving  Current Problems (verified): 1)  COPD  (ICD-496) 2)  Cough  (ICD-786.2) 3)  Dyspnea  (ICD-786.05) 4)  Sleep Apnea  (ICD-780.57) 5)  Heart Attack  (ICD-410.90) 6)  Acute Bronchitis  (ICD-466.0) 7)  Osteoarthritis, Knee, Right, Severe  (ICD-715.96) 8)  Overweight  (ICD-278.02) 9)  Hypertension, Borderline  (ICD-401.9) 10)  Cad  (ICD-414.00) 11)  Hyperlipidemia-mixed  (ICD-272.4) 12)  Hemorrhoids, Internal  (ICD-455.0) 13)  Diverticulosis, Colon  (ICD-562.10) 14)  Colonic Polyps, Adenomatous  (ICD-211.3)  Current Medications (verified): 1)  Multivitamins   Tabs (Multiple Vitamin) .Marland Kitchen.. 1 Tab By Mouth Once Daily 2)  Co Q-10 75 Mg Caps (Coenzyme Q10) .Marland Kitchen.. 1 Tab By Mouth Once Daily 3)  Vitamin D 2000 Unit Tabs (Cholecalciferol) .Marland Kitchen.. 1 Tab By Mouth Once Daily 4)  Aspirin 81 Mg Chw Tab (Aspirin) .... Take One (1) Tablet By Mouth Daily 5)  Symbicort 160-4.5 Mcg/act Aero (Budesonide-Formoterol Fumarate) .... 2 Puffs Twicw  Daily 6)  Proair Hfa 108 (90 Base) Mcg/act Aers (Albuterol Sulfate) .... As Needed 7)  Pravastatin Sodium 40 Mg Tabs (Pravastatin Sodium) .... 1/2 Tab By Mouth Once Daily 8)  Lisinopril-Hydrochlorothiazide 10-12.5 Mg Tabs (Lisinopril-Hydrochlorothiazide) .Marland Kitchen.. 1 Tab By Mouth Once Daily  Allergies (verified): 1)  * Spiriva  Past History:  Past Medical History: Last updated: 05/02/2010 Current Problems:  HEMORRHOIDS, INTERNAL (ICD-455.0) DIVERTICULOSIS, COLON (ICD-562.10) COLONIC POLYPS, ADENOMATOUS (ICD-211.3) CAD  >PCI of the lesion in the proximal LAD using a PROMUS         drug-eluting stent 6.24.09 Diabetes Type 2 Hyperlipidemia Hypertension Asthma/copd  Past Surgical History: Last updated: 06/15/2008 Appendectomy Tonsillectomy Right knee surgery heart cath 11/06/07 s/p drug eluting stent to the left ant. descending 6/09  Family History: Last updated: 12-25-2009 Father:decease, allergies and asthma Grandfather: died of lung problems Mother:decease Siblings:sister diabetes  Social History: Last updated: 12/25/2009 Retired self employed running greenhouse Married Drug Use - no 2 children Tobacco Use - Former pipe smoker x 40 yrs.  Quit in 2011. alcohol 3-6 drinks/month  Review of Systems       Denies fever, malais, weight loss, blurry vision, decreased visual acuity, cough, sputum hemoptysis, pleuritic pain, palpitaitons, heartburn, abdominal pain, melena, lower extremity edema, claudication, or rash.   Vital Signs:  Patient profile:   70 year old male Height:      74 inches Weight:      305 pounds BMI:  39.30 Pulse rate:   72 / minute Resp:     14 per minute BP sitting:   115 / 80  (left arm)  Vitals Entered By: Kem Parkinson (July 08, 2010 11:13 AM)  Physical Exam  General:  Affect appropriate Healthy:  appears stated age HEENT: normal Neck supple with no adenopathy JVP normal no bruits no thyromegaly Lungs clear with no wheezing and  good diaphragmatic motion Heart:  S1/S2 no murmur,rub, gallop or click PMI normal Abdomen: benighn, BS positve, no tenderness, no AAA no bruit.  No HSM or HJR Distal pulses intact with no bruits No edema Neuro non-focal Skin warm and dry    Impression & Recommendations:  Problem # 1:  COPD (ICD-496) F/U pulmonary.  Continue inhalers.  Recent URI F/U AVA His updated medication list for this problem includes:    Symbicort 160-4.5 Mcg/act Aero (Budesonide-formoterol fumarate) .Marland Kitchen... 2 puffs twicw daily    Proair Hfa 108 (90 Base) Mcg/act Aers (Albuterol sulfate) .Marland Kitchen... As needed  Problem # 2:  HEART ATTACK (ICD-410.90) Stent LAD 2008 No angina  Risk factors well modified The following medications were removed from the medication list:    Metoprolol Tartrate 25 Mg Tabs (Metoprolol tartrate) .Marland Kitchen... Take one tablet by mouth twice a day His updated medication list for this problem includes:    Aspirin 81 Mg Chw Tab (Aspirin) .Marland Kitchen... Take one (1) tablet by mouth daily    Lisinopril-hydrochlorothiazide 10-12.5 Mg Tabs (Lisinopril-hydrochlorothiazide) .Marland Kitchen... 1 tab by mouth once daily  Problem # 3:  HYPERTENSION, BORDERLINE (ICD-401.9) Well controlled The following medications were removed from the medication list:    Metoprolol Tartrate 25 Mg Tabs (Metoprolol tartrate) .Marland Kitchen... Take one tablet by mouth twice a day His updated medication list for this problem includes:    Aspirin 81 Mg Chw Tab (Aspirin) .Marland Kitchen... Take one (1) tablet by mouth daily    Lisinopril-hydrochlorothiazide 10-12.5 Mg Tabs (Lisinopril-hydrochlorothiazide) .Marland Kitchen... 1 tab by mouth once daily  Problem # 4:  HYPERLIPIDEMIA-MIXED (ICD-272.4) At goal continue statin The following medications were removed from the medication list:    Crestor 10 Mg Tabs (Rosuvastatin calcium) .Marland Kitchen... At bedtime His updated medication list for this problem includes:    Pravastatin Sodium 40 Mg Tabs (Pravastatin sodium) .Marland Kitchen... 1/2 tab by mouth once daily

## 2010-09-27 NOTE — Discharge Summary (Signed)
[name], [name]             ACCOUNT NO.:  [id number]   MEDICAL RECORD NO.:  [id number]          PATIENT TYPE:  INP   LOCATION:  6525                         FACILITY:  [facility]   PHYSICIAN:  Bruce R. Juanda Chance, MD, FACCDATE OF BIRTH:  11-21-1940   DATE OF ADMISSION:  11/05/2007  DATE OF DISCHARGE:  11/07/2007                         DISCHARGE SUMMARY - REFERRING   PRIMARY CARDIOLOGIST:  Theron Arista C. Eden Emms, MD, [facility].   PRIMARY CARE PHYSICIAN:  Oretha Milch, MD.   DISCHARGE DIAGNOSES:  1. Non-ST elevated myocardial infarction.  2. Coronary artery disease.  3. Status post drug-eluting stent to the left anterior descending.  4. Obesity.  5. Remote tobacco use.  6. History as listed below.   PROCEDURE PERFORMED:  Cardiac catheterization with drug-eluting stent to  the proximal LAD placed in a TRACER study by Dr. Charlies Constable.   BRIEF HISTORY:  [name] is a 70 year old obese white male who  presented with a dull heavy pressure in the chest that began around 1:00  a.m. and initially attributed to gas.  He developed shortness of breath  and lasted approximately 30 minutes.  However, it returned.  Due to the  continued presentation, his wife drove him to the emergency room for  further evaluation.   PAST MEDICAL HISTORY:  Notable for  1. Remote tobacco use.  2. Diverticulosis.  3. Asthma.   MEDICATIONS PRIOR TO ADMISSION:  He was not on any medications prior to  admission.   LABORATORY:  Chest x-ray on the 23rd showed low-volume chest film with  vascular crowding and streaky atelectasis, bronchitic changes likely  related to smoking.  No acute findings.   Admission weight was 140.4 kg.  Admission H&H was 15.2 and 45.5, normal  indices, platelets 239, WBCs 10.9.  Prior to discharge, H&H was 13.9 and  40, normal indices, platelets 219, WBCs 8.2.  PTT was 28, PT 13.4.  Sodium 140, potassium 3.7, BUN 12, creatinine 0.87, glucose 110.  Normal  LFTs.  Prior to discharge, BUN and  creatinine was 11 and 0.92, potassium  4.1.  CK, MB, relative index did not reveal myocardial injury pattern.  However, troponins were slightly elevated at 0.14, 0.54, 0.39, 0.42,  0.49, 0.62 and 0.44.  TSH was 1.024.   EKGs showed sinus bradycardia, normal sinus rhythm, first-degree AV  block, left axis deviation, left anterior fascicular block, delayed R-  wave with anterior septal T-wave inversion.  Echocardiogram on the 24th  showed EF of 55%, mild to moderate LVH, aortic valve calcification.   HOSPITAL COURSE:  [name] was admitted to [facility]. [facility] by Bettey Mare. Lyman Bishop, NP, as well as Dr. Eden Emms.  Cardiac  rehab assisted with education and ambulation and the research team  provided the patient with information in regards to the TRACER study.  He was placed on IV heparin and a beta blocker as well as aspirin and it  was felt, given his symptoms, he should undergo cardiac catheterization.  This was performed on November 06, 2007, by Dr. Charlies Constable.  Dr. Juanda Chance  performed drug-eluting stent to the proximal LAD, residual  coronary  artery disease with medical treatment, did note some mid anterior  hypokinesis, EF 55%.  By the 25th, the patient was ambulating and  catheterization site was intact.  Dr. Juanda Chance felt that the patient could  be discharged home.   DISPOSITION:  The patient is discharged home on November 07, 2007.  He is  asked to maintain low-sodium heart-healthy diet.  Wound care as per  supplemental sheet.  Postcatheterization, his activities were restricted  in regards to lifting, driving, sexual activity for 1 week.  He will  follow up with Dr. Eden Emms on November 27, 2007, at 9:00 a.m.   He received new prescriptions for  1. Plavix 75 mg daily at least for 1 year.  2. Metoprolol 25 mg b.i.d.  3. Simvastatin 40 mg at bedtime.  4. Nitroglycerin 0.4 p.r.n.  He was asked to bring all medications to all appointments.   He will need blood work in 6-8 weeks  since simvastatin was initiated.   DISCHARGE TIME:  Approximately 35 minutes.      Joellyn Rued, PA-C      Bruce R. Juanda Chance, MD, [facility]  Electronically Signed    EW/MEDQ  D:  11/07/2007  T:  11/07/2007  Job:  [id number]   cc:   Oretha Milch, MD

## 2010-09-27 NOTE — Assessment & Plan Note (Signed)
[facility]                            CARDIOLOGY OFFICE NOTE   NAME:[name], [name] CRACE                    MRN:          [id number]  DATE:01/09/2008                            DOB:          01/19/1941    [name] [name] returns today for followup.  He has subendocardial MI with  stenting of the LAD in June.  He had residual moderate right coronary  artery disease.  He had an adenosine Myoview study performed on January 07, 2008.  I reviewed the study with him, it was low risk.  There was no  inferior wall ischemia and mild apical thinning.   He has not had any significant chest pain.   He has lost about 10 pounds since I saw him, but he continues to be  significantly overweight.  He has some exertional dyspnea.  His activity  is limited by right knee pain; however, he is trying to be more active  and actually may try to throw the discus for the senior over 70 [employer]  Games.   I talked to him at length about his risk factor modification and weight  loss.   His review of systems is remarkable for no chest pain, no palpitations,  no syncope.  He has exertional dyspnea, which appears functional  secondary to his weight.   He is on,  1. Plavix 75 a day.  2. Aspirin.  3. Simvastatin 40 a day.  4. Research study drug, which I believe is an anticoagulant.   He has no known allergies.   PHYSICAL EXAMINATION:  VITAL SIGNS:  His weight is 298, blood pressure  is 140/80, pulse 56 and regular, respiratory rate 14, afebrile.  HEENT:  Unremarkable.  NECK:  Carotids are normal without bruit.  No lymphadenopathy,  thyromegaly, or JVP elevation.  Lungs:  Clear, good diaphragmatic motion.  No wheezing.  CARDIAC:  S1 and S2 with distant heart sounds.  PMI not palpable.  ABDOMEN:  Protuberant.  Bowel sounds positive.  No AAA.  No tenderness.  No bruit.  No hepatosplenomegaly or hepatojugular reflux.  No  tenderness.  EXTREMITIES:  Distal pulses are intact.  No  edema.  NEUROLOGIC:  Nonfocal.  SKIN:  Warm and dry.  He walks with a bit of a limp and has right knee  weakness.   EKG shows sinus rhythm with poor R-wave progression.  No transmural  infarct.   IMPRESSION:  1. Coronary artery disease, stent to the left anterior descending, low-      risk Myoview, no chest pain.  Continue on aspirin, beta-blocker,      and Plavix.  2. Hypertension, currently borderline controlled.  May need to add low-      dose ACE inhibitor in the future.  3. Risk factor modification at risk for type 2 diabetes.  Check      hemoglobin A1c per primary care MD.  4. Hypercholesterolemia.  Continue simvastatin.  Lipid and liver      profile in 6 months.  5. Research study drug to follow up with our research nurses in the      next  month.  I will have to go back and look through his cath      record, but I suspect he was placed on an anticoagulant in addition      to his aspirin and Plavix.   I will see [name] back in 6 months.  He will call me if he has any  problems.     Noralyn Pick. Eden Emms, MD, [facility]  Electronically Signed    PCN/MedQ  DD: 01/09/2008  DT: 01/09/2008  Job #: [id number]

## 2010-09-27 NOTE — Assessment & Plan Note (Signed)
[facility]                            CARDIOLOGY OFFICE NOTE   NAME:[name], [name]                    MRN:          [id number]  DATE:12/09/2007                            DOB:          06-28-1940    Mr. Stepanek returns today for followup.   He was seen in the emergency room on November 05, 2007 with unstable angina,  subendocardial MI.  He subsequently had angioplasty of the proximal LAD.  He had good LV function.  He had residual 60-70% RCA disease.  I am  talking to the patient, he had significant issues with central obesity.  He has signed up for cardiac rehab.  He went to one session.  I told him  that he needs to really work on his weight.  He talked about using the  [employer] and actually got a book, I think this would be excellent.  The patient has not had any recurrent chest pain.  He has exertional  dyspnea and lower extremity edema.  This is undoubtedly due to his  weight.   The patient needs a followup adenosine Myoview study at the end of  August to restratify his RCA disease.   He is otherwise doing well.   REVIEW OF SYSTEMS:  His review of systems is remarkable for 3 to 4 years  of an umbilical hernia, which is reducible and not in need of surgery.  He has chronic right knee pain with previous surgery.  He has mild lower  extremity edema and he does not want a diuretic.   CURRENT MEDICATIONS:  1. Plavix 75 a day.  2. Simvastatin 40 a day.  3. Study drug and an aspirin a day.  4. Metoprolol 25 b.i.d.   PHYSICAL EXAMINATION:  VITAL SIGNS:  Remarkable for blood pressure  123/70, pulse 60 and regular, and respiratory rate 14, afebrile.  Weight  is 301.  HEENT:  Unremarkable.  NECK:  Carotids are without bruit.  No lymphadenopathy, thyromegaly, or  JVP elevation.  LUNGS:  Clear diaphragmatic motion.  No wheezing.  S1 and S2.  Normal  heart sounds.  PMI normal.  ABDOMEN:  Benign.  He has a large umbilical hernia.  No  hepatosplenomegaly.  No hepatojugular Reflux.  No bruit and the cath  site is well healed.  EXTREMITIES:  Distal pulses are intact, +1 edema bilaterally.  NEURO:  Nonfocal.  No muscular weakness.  SKIN:  Warm and dry.   EKG shows sinus rhythm with left axis deviation.   I have reviewed the patient's angiogram on CV image, I think that his  RCA disease is moderate and probably not in need of intervention.  We  will see what his functional site looks like at the end of August.   Time to review cath 15 minutes.   IMPRESSION:  1. Coronary artery disease, subendocardial myocardial infarction stent      to the left anterior descending.  Continue on aspirin, Plavix, and      beta-blocker.  Residual right coronary artery disease.  Followup      Myoview at the end of August.  The patient ruled in cardiac rehab.  2. Lower extremity edema, low-salt diet, encourage weight loss, p.r.n.      diuretics.  3. Hypercholesteremia in the setting of coronary artery disease.      Continue statin drug.  Lipid and liver profile in 6 months.  4. Umbilical hernia.  No need for general surgery.  Consultation at      this time again would benefit from weight loss.  5. Central obesity, the patient will try to adhere to this [employer].   I encouraged him to follow up with his nutritionist and see Dr. Felipa Eth.   I will see him in August when he has his followup Myoview.   Chronic right knee pain with osteoarthritis.  I encouraged the patient  to use Naprosyn for an anti-inflammatory as it is probably the safest  for a heart patient.  Followup with orthopedics.     Noralyn Pick. Eden Emms, MD, [facility]  Electronically Signed    PCN/MedQ  DD: 12/09/2007  DT: 12/10/2007  Job #: [id number]

## 2010-09-27 NOTE — Assessment & Plan Note (Signed)
[facility]                         GASTROENTEROLOGY OFFICE NOTE   NAME:[name] [name] WATCHMAN                    MRN:          [id number]  DATE:01/16/2007                            DOB:          May 29, 1940    HISTORY:  [name] [name] presents today to the office for evaluation of  rectal bleeding.  He has a history of colon polyps and recently  underwent complete colonoscopy, January 09, 2007.  He was found to have  diminutive right-sided colon polyps, which were removed.  Also, moderate  left-sided diverticulosis.  Over the weekend, he contacted the on-call  doctor, stating that he was having rectal bleeding with bowel movements.  He did not feel it was severe enough to be evaluated.  He contacted our  office yesterday with the same story.  We obtained a stat CBC, which was  normal with a hemoglobin of 14.8.  He reports having had a normal bowel  movement today.  Basically, he was having bleeding, associated with a  normal-appearing stool, no rectal discomfort, no abdominal pain or other  symptoms.   He is on no medications.   PHYSICAL EXAM:  Finds a well-appearing male, in no acute distress.  Blood pressure 130/92, heart rate 64, weight is 291 pounds.  RECTAL EXAM:  Reveals no external abnormalities.  There is a moderate,  slightly inflamed internal hemorrhoid.  Stool is brown.   IMPRESSION:  1. Recent problems with rectal bleeding, due to internal hemorrhoid.  2. Incidental diverticulosis without bleeding.  3. Diminutive colon polyps, status post polypectomy.   RECOMMENDATIONS:  1. Analpram cream at night for hemorrhoids.  2. Increase dietary fiber.  3. Brochure on hemorrhoids.  4. Followup colonoscopy in five years, as previously recommended.     [name] [name]. [name] Goodell, MD  Electronically Signed    JNP/MedQ  DD: 01/16/2007  DT: 01/16/2007  Job #: [id number]   cc:   [name] [name], M.D.

## 2010-09-27 NOTE — Assessment & Plan Note (Signed)
[facility]                            CARDIOLOGY OFFICE NOTE   NAME:[name], [name]                    MRN:          [id number]  DATE:06/16/2008                            DOB:          1940/06/01    [name] returns today for followup and unfortunately, he continues to be  heavy.  He has been having increasing knee pain, particularly in the  right side.  He is status post drug-eluting stent to the LAD in June.  He has been compliant with his aspirin and Plavix.   He has had a bit of bruising in his right arm trying to change a faucet.   He is not having any significant chest pain.  He has mild chronic  exertional dyspnea and lower extremity edema.   He has followed up with Dr. Felipa Eth for his general medical needs.  He is  currently non-diabetic, but has significant hypertension and  hyperlipidemia.   REVIEW OF SYSTEMS:  Remarkable for some memory problems and some joint  pains, particularly in his hands.  These may be a side effect from his  simvastatin.   He had a note from Dr. Felipa Eth indicating a question of starting an ACE  inhibitor.   ALLERGIES:  He has no known allergies.   He is currently taking; aspirin a day, Plavix, simvastatin 40, study  drug from one of our research trials, and Coenzyme Q.  I believe the  study drug is an antiplatelet agent.   PHYSICAL EXAMINATION:  VITAL SIGNS:  His current weight is 305; blood  pressure 145/90; pulse 53 and regular; and respiratory rate 14,  afebrile.  HEENT:  Unremarkable.  NECK:  Carotids are normal without bruit.  No lymphadenopathy,  thyromegaly, or JVP elevation.  LUNGS:  Clear, good diaphragmatic motion.  No wheezing.  CARDIAC:  S1 and S2, distant heart sounds.  PMI normal.  ABDOMEN:  Benign.  Bowel sounds positive.  No AAA.  No tenderness.  No  bruit.  No hepatosplenomegaly.  No hepatojugular reflux.  EXTREMITIES:  Distal pulses are intact.  No edema.  NEURO:  Nonfocal.  SKIN:  Warm  and dry.  MUSCULOSKELETAL:  No muscular weakness.   IMPRESSION:  1. Coronary artery disease, previous stent to the left anterior      descending.  Continue aspirin and Plavix.  2. Borderline hypertension in the patient with significant coronary      artery disease.  I agree with Dr. Felipa Eth and we will start him on      lisinopril/hydrochlorothiazide 20/12.5.  I will see him back in      about 8 weeks to see where his blood pressure lies.  He knows that      if he were to lose weight and decrease his salt intake.  His blood      pressure to be better controlled.  3. Hypercholesterolemia.  For the time being, we will stop his      simvastatin and I am concerned that his memory problems and joint      pains are related to this.  If he has improvement in  these      symptoms, off simvastatin.  We may try him on Crestor later date.      Continue low-cholesterol diet.  4. Research study drug, antiplatelet agent.  Continue aspirin.  Follow      up with the study nurses  5. Morbid obesity.  The patient has been involved with a [employer] in the past.  I encouraged him to get back.  I      believe he is to throw the discus.  I gave him Dr. Ebony Hail name is      to contact.  He is not a great candidate for bariatric surgery      given his coronary artery disease.   He needs to be more physically active.     Noralyn Pick. Eden Emms, MD, [facility]  Electronically Signed    PCN/MedQ  DD: 06/16/2008  DT: 06/17/2008  Job #: [id number]

## 2010-09-27 NOTE — Cardiovascular Report (Signed)
NAMEFRANCE, NOYCE             ACCOUNT NO.:  [id number]   MEDICAL RECORD NO.:  [id number]          PATIENT TYPE:  INP   LOCATION:  6525                         FACILITY:  [facility]   PHYSICIAN:  Bruce R. Juanda Chance, MD, FACCDATE OF BIRTH:  01-05-1941   DATE OF PROCEDURE:  11/06/2007  DATE OF DISCHARGE:                            CARDIAC CATHETERIZATION   CLINICAL HISTORY:  Mr. Weed is 70 years old and has no prior history  of known heart disease.  He is admitted with chest pain thought to  represent an acute coronary syndrome and his enzymes returned positive  for a non-ST-elevation myocardial infarction.  He was enrolled in a  tracer study and scheduled for evaluation with angiography.   PROCEDURE:  The procedure was followed in the right femoral arterial  sheath and 6-French pre-formed coronary catheters.  A front wall  arterial puncture was performed.  Omnipaque contrast was used.  Took  patient to diagnostic study and made a decision to proceed with  intervention on the lesion in the proximal LAD.   The patient was given Angiomax bolus and infusion.  He was given 600 mg  load of Plavix.  He had been randomized to tracer study and received  tracer study drug orally.  He had also previously received chewable  aspirin.   We chose a CLS 4.5, 6-French guiding catheter without side holes.  The  anatomy was quite unusual with a first diagonal branch, which was  actually larger than the LAD.  The LAD had a sharp bend off the left  main and then another sharp bend immediately after this making  navigation difficult.  The lesion was just after the second bend.  With  some difficulty, we were able to navigate a PT to light support wire  across the lesion.  We predilated with a 2.25 x 12-mm apex balloon  performing 2 inflation of 8 atmospheres for 30 seconds.  We then  deployed a 2.5 x 12-mm Palmaz stent about 2-3 mm from the ostium and the  LAD.  We deployed this with 1 inflation of 12  atmospheres for 30  seconds.  We then postdilated with a 2.75 x 8 mm Hosmer Voyager performing 2  inflations to 16 atmospheres for 30 seconds.   Final diagnosis was then performed with a Guidant catheter.  The patient  tolerated the procedure well and left the laboratory in satisfactory  condition.   RESULTS:  Left main coronary artery:  The left main coronary artery was  free of disease.   Left anterior descending artery:  __________ gave rise to a very large  diagonal branch and then the LAD which gave rise to a septal perforator  and a second small diagonal branch.  There was 95% stenosis in the  proximal portion of the LAD after a sharp bend and before the septal  perforator.  There was 4% narrowing in the midportion of the LAD.  There  was 99% stenosis in the second small diagonal branch with TIMI I flow,  which arose from the mid LAD.   The circumflex artery gave rise to a marginal  branch, a posterolateral  branch.  These were large vessels that were free of significant disease.   The right coronary artery was a moderate-sized vessel gave rise to a  conus branch, to right ventricle branch, the posterior descending  branch, and a small posterolateral branch.  There was 70% narrowing in  the mid-right coronary artery.   The left ventriculogram __________ showed hypokinesis of the anterior  wall.  The ventricular apex was spared.  The estimated ejection fraction  was 55%.   Following stenting of the lesion in the proximal LAD, the stenosis  improved from 95% to 0%.   The aortic pressure was 134/82 with a mean of 104 and left neck pressure  was 134/29.   CONCLUSION:  1. Non-ST-elevation myocardial infarction with 95% stenosis in the      proximal LAD and 40% narrowing in the mid LAD with 99% stenosis of      the small second diagonal branch, no significant obstruction of      circumflex artery, 70% narrowing in the mid-right coronary and      anterior wall hypokinesis with an  estimated ejection fraction of      55%.  2. Successful PCI of the lesion in the proximal LAD using a PROMUS      drug-eluting stent with improvements in the narrowing from 95% to      0%.   DISPOSITION:  The patient then presents for further observation.  Recommended Plavix for at least a year.  I have also been in the tracer  study.      Bruce Elvera Lennox Juanda Chance, MD, [facility]  Electronically Signed     BRB/MEDQ  D:  11/06/2007  T:  11/07/2007  Job:  [id number]   cc:   Noralyn Pick. Eden Emms, MD, [facility]  Larina Earthly, M.D.

## 2010-09-27 NOTE — H&P (Signed)
[name], [name]             ACCOUNT NO.:  [id number]   MEDICAL RECORD NO.:  [id number]          PATIENT TYPE:  INP   LOCATION:  3735                         FACILITY:  [facility]   PHYSICIAN:  Peter C. Eden Emms, MD, FACCDATE OF BIRTH:  1940-09-22   DATE OF ADMISSION:  11/05/2007  DATE OF DISCHARGE:                              HISTORY & PHYSICAL   PRIMARY CARDIOLOGIST:  Theron Arista C. Eden Emms, MD, [facility]   PRIMARY CARE PHYSICIAN:  Oretha Milch, MD   HISTORY OF PRESENT ILLNESS:  This is a 70 year old obese Caucasian male  with no prior cardiac history with complaints of dull heavy pressure  behind the sternum which began around 1 a.m. last night, however, the  patient admits to discomfort in the trachea with gas and tightness  with shortness of breath while at rest in bed watching TV earlier that  evening.  The patient states it lasted less than 1/2 hour.  He took some  inhaler puffs in baking soda and slept and then 1 a.m. reoccurrence of  discomfort which was worse than original discomfort hours before with  associated diaphoresis and mild shortness of breath and tightness.  His  wife took his blood pressure and it was found to be elevated at 210/130.  Last discomfort in his chest at that time lasted 15-30 minutes and went  away on its own.  The patient went back to sleep and this a.m. about  5:30 after he was already awake and that the patient had recurrence of  chest pain which was between the shoulder blades and then chest pressure  which he described as a dull pain with associated diaphoresis and  shortness of breath.  The wife drove him to the emergency room.  The  pain lasted approximately 30 minutes.  He was given aspirin p.o. in  route and is now in the ER and pain free.  The patient denies any prior  cardiac history or evaluation by cardiologist in the past.  The patient  tends to minimize his symptoms.   REVIEW OF SYSTEMS:  Positive for diaphoresis, chest pain, shortness of  breath, and lower extremity edema with GERD like symptoms as well.   PAST MEDICAL HISTORY:  Diverticulosis, hemorrhoids, and asthma.   PAST SURGICAL HISTORY:  Appendectomy, tonsillectomy, and right knee  arthroscopy.   SOCIAL HISTORY:  He lives in [place] with his wife.  He is a retired  Occupational hygienist.  He is a 30 pack-year smoker and has stopped for the last  few years, but after stopping cigarettes he smoked cigars about 6 a day.  He does drink a couple of beers a week.  No drug use.   FAMILY HISTORY:  Mother deceased of unknown cause.  Father had an MI and  died of complications of CHF at age 72.  He has 2 sisters in good  health.   CURRENT MEDICATIONS:  Home multivitamin.   ALLERGIES:  No known drug allergies.   LABORATORY DATA:  Hemoglobin 15.2, hematocrit 45.5, white blood cells  10.0, platelets 239.  Sodium 140, potassium 3.7, chloride 101, CO2 29,  glucose 110,  BUN 12, creatinine 0.97, calcium 8.9.  Troponin in initial  point-of-care 0.05, followup troponin 0.14.  CK 94, MB 5.6, PT 13.4, INR  1.08, APTT 28, BNP 66.  Chest x-ray revealing low lung volume chest film  with vascular crowding and streaky atelectasis.  Bronchitic changes are  likely related to smoking with no infiltrates, edema, or effusions.  EKG  revealing normal sinus rhythm with left axis deviation and anterior J-  point elevation noted.  Subsequent EKG reveals anterior T-wave inversion  in leads V1 and V2.   PHYSICAL EXAMINATION:  VITAL SIGNS:  Blood pressure 156/97, pulse 63,  respirations 16, temperature 97.6, O2 sat 95% on room air.  GENERAL:  He is awake, alert, and oriented.  No acute distress.  HEENT:  Head is normocephalic and atraumatic.  Eyes:  PERRLA.  Mucous  membranes and mouth are pink and moist.  Tongue is midline.  NECK:  Supple, obese, no bruits.  No JVD is noted.  CARDIOVASCULAR:  Regular rate and rhythm without murmurs, rubs, or  gallops.  Pulses are 2+ and equal without bruits.   LUNGS:  Have inspiratory wheezes and mild crackles without rales or  rhonchi.  ABDOMEN:  Obese with large umbilical hernia.  EXTREMITIES:  With edema bilaterally, right greater than left without  clubbing or cyanosis.  Dorsalis pedis pulses are 1+ bilaterally.  NEUROLOGIC:  Cranial nerves II-XII are grossly intact.   IMPRESSION:  1. Unstable angina.  2. Hypertension.  3. Obesity.   PLAN:  This is a 70 year old obese Caucasian male with no prior cardiac  history with recurrent chest pain, diaphoresis, and shortness of breath  since last night, worrisome for unstable angina.  He has multiple  cardiovascular risk factors.  We will admit to rule out myocardial  infarction.  Start nitroglycerin and heparin, low-dose ACE.  Wheezing  currently.  No beta blocker will be added at this time.  We will add  proton pump inhibitor inhalers p.r.n. if necessary at a later date.  The  patient will be planned for cardiac catheterization.  We discussed the  risks and benefits with the patient of catheterization and he is willing  to proceed.  We will continue heparin and nitroglycerin.  He may need to  be on low-dose ACE inhibitor with hydrochlorothiazide.  On discharge, we  will make further recommendations based upon hospital course and the  patient's response to treatment along with results of catheterization.      Bettey Mare. Lyman Bishop, NP      Noralyn Pick. Eden Emms, MD, [facility]  Electronically Signed    KML/MEDQ  D:  11/05/2007  T:  11/05/2007  Job:  [id number]   cc:   Oretha Milch, MD

## 2011-02-09 LAB — DIFFERENTIAL
Basophils Absolute: 0.1
Basophils Relative: 1
Basophils Relative: 1
Eosinophils Absolute: 0.2
Eosinophils Relative: 1
Eosinophils Relative: 3
Lymphocytes Relative: 27
Lymphocytes Relative: 34
Lymphs Abs: 2.2
Lymphs Abs: 3.1
Lymphs Abs: 3.7
Monocytes Absolute: 0.7
Monocytes Relative: 7
Monocytes Relative: 9
Neutro Abs: 5
Neutro Abs: 5
Neutrophils Relative %: 54
Neutrophils Relative %: 61

## 2011-02-09 LAB — CBC
HCT: 41.7
Hemoglobin: 13.9
Hemoglobin: 15.2
MCV: 89.7
MCV: 90.1
Platelets: 214
Platelets: 239
RBC: 5.05
RDW: 14
RDW: 14.1
RDW: 14.1
WBC: 10

## 2011-02-09 LAB — COMPREHENSIVE METABOLIC PANEL
AST: 19
Albumin: 3.6
Alkaline Phosphatase: 78
CO2: 29
Chloride: 101
Creatinine, Ser: 0.87
GFR calc Af Amer: >60
GFR calc non Af Amer: >60
Glucose, Bld: 110 — ABNORMAL HIGH
Potassium: 3.7
Total Bilirubin: 0.8

## 2011-02-09 LAB — HEPARIN LEVEL (UNFRACTIONATED): Heparin Unfractionated: 0.8 — ABNORMAL HIGH

## 2011-02-09 LAB — BASIC METABOLIC PANEL
Calcium: 8.8
GFR calc Af Amer: >60
GFR calc non Af Amer: >60
Glucose, Bld: 102 — ABNORMAL HIGH
Sodium: 141

## 2011-02-09 LAB — CK TOTAL AND CKMB (NOT AT ARMC)
CK, MB: 5.2 — ABNORMAL HIGH
CK, MB: 5.6 — ABNORMAL HIGH
Total CK: 72
Total CK: 94

## 2011-02-09 LAB — POCT I-STAT, CHEM 8
BUN: 13
Chloride: 100
Creatinine, Ser: 1
Glucose, Bld: 121 — ABNORMAL HIGH
Hemoglobin: 16
Sodium: 137

## 2011-02-09 LAB — CARDIAC PANEL(CRET KIN+CKTOT+MB+TROPI)
CK, MB: 5.5 — ABNORMAL HIGH
Relative Index: INVALID
Total CK: 70
Total CK: 71
Total CK: 76

## 2011-02-09 LAB — PROTIME-INR: INR: 1

## 2011-02-09 LAB — POCT CARDIAC MARKERS: Operator id: 270651

## 2011-02-09 LAB — B-NATRIURETIC PEPTIDE (CONVERTED LAB): Pro B Natriuretic peptide (BNP): 66

## 2011-02-09 LAB — TROPONIN I: Troponin I: 0.14 — ABNORMAL HIGH

## 2011-02-09 LAB — TSH: TSH: 1.024

## 2011-04-25 ENCOUNTER — Other Ambulatory Visit: Payer: Self-pay

## 2011-04-25 ENCOUNTER — Encounter (HOSPITAL_COMMUNITY)
Admission: EM | Disposition: A | Discharge status: Home / Self Care | Payer: Self-pay | Source: Home / Self Care | Attending: Cardiology

## 2011-04-25 ENCOUNTER — Inpatient Hospital Stay (HOSPITAL_COMMUNITY)
Admission: EM | Admit: 2011-04-25 | Discharge: 2011-05-01 | DRG: 286 | Disposition: A | Discharge status: Home / Self Care | Payer: Medicare Other | Attending: Cardiology | Admitting: Cardiology

## 2011-04-25 ENCOUNTER — Encounter: Payer: Self-pay | Admitting: Emergency Medicine

## 2011-04-25 DIAGNOSIS — I498 Other specified cardiac arrhythmias: Secondary | ICD-10-CM | POA: Diagnosis present

## 2011-04-25 DIAGNOSIS — E669 Obesity, unspecified: Secondary | ICD-10-CM | POA: Diagnosis present

## 2011-04-25 DIAGNOSIS — I2119 ST elevation (STEMI) myocardial infarction involving other coronary artery of inferior wall: Principal | ICD-10-CM | POA: Diagnosis present

## 2011-04-25 DIAGNOSIS — F17200 Nicotine dependence, unspecified, uncomplicated: Secondary | ICD-10-CM | POA: Diagnosis present

## 2011-04-25 DIAGNOSIS — J4489 Other specified chronic obstructive pulmonary disease: Secondary | ICD-10-CM

## 2011-04-25 DIAGNOSIS — J440 Chronic obstructive pulmonary disease with acute lower respiratory infection: Secondary | ICD-10-CM | POA: Diagnosis present

## 2011-04-25 DIAGNOSIS — I2510 Atherosclerotic heart disease of native coronary artery without angina pectoris: Secondary | ICD-10-CM

## 2011-04-25 DIAGNOSIS — R0602 Shortness of breath: Secondary | ICD-10-CM

## 2011-04-25 DIAGNOSIS — I10 Essential (primary) hypertension: Secondary | ICD-10-CM | POA: Insufficient documentation

## 2011-04-25 DIAGNOSIS — I440 Atrioventricular block, first degree: Secondary | ICD-10-CM | POA: Diagnosis present

## 2011-04-25 DIAGNOSIS — G4730 Sleep apnea, unspecified: Secondary | ICD-10-CM | POA: Diagnosis present

## 2011-04-25 DIAGNOSIS — R001 Bradycardia, unspecified: Secondary | ICD-10-CM

## 2011-04-25 DIAGNOSIS — Z888 Allergy status to other drugs, medicaments and biological substances status: Secondary | ICD-10-CM

## 2011-04-25 DIAGNOSIS — I441 Atrioventricular block, second degree: Secondary | ICD-10-CM

## 2011-04-25 DIAGNOSIS — Z7902 Long term (current) use of antithrombotics/antiplatelets: Secondary | ICD-10-CM

## 2011-04-25 DIAGNOSIS — Z9861 Coronary angioplasty status: Secondary | ICD-10-CM

## 2011-04-25 DIAGNOSIS — R0600 Dyspnea, unspecified: Secondary | ICD-10-CM

## 2011-04-25 DIAGNOSIS — I509 Heart failure, unspecified: Secondary | ICD-10-CM | POA: Diagnosis present

## 2011-04-25 DIAGNOSIS — M109 Gout, unspecified: Secondary | ICD-10-CM | POA: Insufficient documentation

## 2011-04-25 DIAGNOSIS — J209 Acute bronchitis, unspecified: Secondary | ICD-10-CM | POA: Insufficient documentation

## 2011-04-25 DIAGNOSIS — M1990 Unspecified osteoarthritis, unspecified site: Secondary | ICD-10-CM | POA: Insufficient documentation

## 2011-04-25 DIAGNOSIS — I219 Acute myocardial infarction, unspecified: Secondary | ICD-10-CM

## 2011-04-25 DIAGNOSIS — E663 Overweight: Secondary | ICD-10-CM | POA: Insufficient documentation

## 2011-04-25 DIAGNOSIS — I4891 Unspecified atrial fibrillation: Secondary | ICD-10-CM

## 2011-04-25 DIAGNOSIS — Z720 Tobacco use: Secondary | ICD-10-CM | POA: Insufficient documentation

## 2011-04-25 DIAGNOSIS — I5021 Acute systolic (congestive) heart failure: Secondary | ICD-10-CM | POA: Diagnosis present

## 2011-04-25 DIAGNOSIS — Z9181 History of falling: Secondary | ICD-10-CM

## 2011-04-25 DIAGNOSIS — J449 Chronic obstructive pulmonary disease, unspecified: Secondary | ICD-10-CM

## 2011-04-25 DIAGNOSIS — E785 Hyperlipidemia, unspecified: Secondary | ICD-10-CM | POA: Insufficient documentation

## 2011-04-25 HISTORY — PX: LEFT HEART CATHETERIZATION WITH CORONARY ANGIOGRAM: SHX5451

## 2011-04-25 HISTORY — DX: Obesity, unspecified: E66.9

## 2011-04-25 HISTORY — DX: Chronic obstructive pulmonary disease, unspecified: J44.9

## 2011-04-25 HISTORY — DX: Atrioventricular block, second degree: I44.1

## 2011-04-25 HISTORY — DX: Diverticulosis of intestine, part unspecified, without perforation or abscess without bleeding: K57.90

## 2011-04-25 HISTORY — DX: Unspecified osteoarthritis, unspecified site: M19.90

## 2011-04-25 HISTORY — DX: Obstructive sleep apnea (adult) (pediatric): G47.33

## 2011-04-25 HISTORY — DX: Gastro-esophageal reflux disease without esophagitis: K21.9

## 2011-04-25 HISTORY — DX: Atherosclerotic heart disease of native coronary artery without angina pectoris: I25.10

## 2011-04-25 HISTORY — DX: Hyperlipidemia, unspecified: E78.5

## 2011-04-25 LAB — POCT I-STAT, CHEM 8
BUN: 10 mg/dL (ref 6–23)
Creatinine, Ser: 0.9 mg/dL (ref 0.50–1.35)
Glucose, Bld: 123 mg/dL — ABNORMAL HIGH (ref 70–99)
Potassium: 3.9 mEq/L (ref 3.5–5.1)
Sodium: 137 mEq/L (ref 135–145)

## 2011-04-25 LAB — COMPREHENSIVE METABOLIC PANEL
ALT: 54 U/L — ABNORMAL HIGH (ref 0–53)
AST: 242 U/L — ABNORMAL HIGH (ref 0–37)
Alkaline Phosphatase: 91 U/L (ref 39–117)
CO2: 28 mEq/L (ref 19–32)
Calcium: 9.3 mg/dL (ref 8.4–10.5)
Chloride: 96 mEq/L (ref 96–112)
GFR calc Af Amer: >90 mL/min (ref >90)
GFR calc non Af Amer: >90 mL/min (ref >90)
Glucose, Bld: 119 mg/dL — ABNORMAL HIGH (ref 70–99)
Potassium: 4.1 mEq/L (ref 3.5–5.1)
Sodium: 137 mEq/L (ref 135–145)

## 2011-04-25 LAB — CARDIAC PANEL(CRET KIN+CKTOT+MB+TROPI)
CK, MB: 154.4 ng/mL (ref 0.3–4.0)
Relative Index: 8.7 — ABNORMAL HIGH (ref 0.0–2.5)
Total CK: 1779 U/L — ABNORMAL HIGH (ref 7–232)
Troponin I: >25 ng/mL (ref <0.30)

## 2011-04-25 LAB — PROTIME-INR: Prothrombin Time: 14.4 seconds (ref 11.6–15.2)

## 2011-04-25 SURGERY — LEFT HEART CATHETERIZATION WITH CORONARY ANGIOGRAM
Anesthesia: LOCAL

## 2011-04-25 MED ORDER — SODIUM CHLORIDE 0.9 % IJ SOLN: 3.0000 mL | INTRAMUSCULAR | Status: DC | PRN

## 2011-04-25 MED ORDER — BUDESONIDE-FORMOTEROL FUMARATE 160-4.5 MCG/ACT IN AERO
2.0000 | INHALATION_SPRAY | Freq: Two times a day (BID) | RESPIRATORY_TRACT | Status: DC
  Administered 2011-04-26 – 2011-05-01 (×11): 2 via RESPIRATORY_TRACT
  Filled 2011-04-25: qty 6

## 2011-04-25 MED ORDER — ASPIRIN EC 325 MG PO TBEC: 325.0000 mg | DELAYED_RELEASE_TABLET | Freq: Every day | ORAL | Status: DC

## 2011-04-25 MED ORDER — SODIUM CHLORIDE 0.9 % IV SOLN: INTRAVENOUS | Status: AC

## 2011-04-25 MED ORDER — ALBUTEROL SULFATE HFA 108 (90 BASE) MCG/ACT IN AERS: 2.0000 | INHALATION_SPRAY | RESPIRATORY_TRACT | Status: DC | PRN

## 2011-04-25 MED ORDER — VITAMIN D3 25 MCG (1000 UNIT) PO TABS
1000.0000 [IU] | ORAL_TABLET | Freq: Every day | ORAL | Status: DC
  Administered 2011-04-25 – 2011-05-01 (×7): 1000 [IU] via ORAL
  Filled 2011-04-25 (×7): qty 1

## 2011-04-25 MED ORDER — ROSUVASTATIN CALCIUM 40 MG PO TABS
40.0000 mg | ORAL_TABLET | Freq: Every day | ORAL | Status: DC
  Administered 2011-04-25 – 2011-04-30 (×6): 40 mg via ORAL
  Filled 2011-04-25 (×7): qty 1

## 2011-04-25 MED ORDER — CLOPIDOGREL BISULFATE 75 MG PO TABS
75.0000 mg | ORAL_TABLET | Freq: Every day | ORAL | Status: DC
  Administered 2011-04-26 – 2011-05-01 (×6): 75 mg via ORAL
  Filled 2011-04-25 (×7): qty 1

## 2011-04-25 MED ORDER — HEPARIN (PORCINE) IN NACL 2-0.9 UNIT/ML-% IJ SOLN
INTRAMUSCULAR | Status: AC
  Filled 2011-04-25: qty 2000

## 2011-04-25 MED ORDER — LISINOPRIL 20 MG PO TABS
20.0000 mg | ORAL_TABLET | Freq: Every day | ORAL | Status: DC
  Administered 2011-04-26 – 2011-04-28 (×3): 20 mg via ORAL
  Filled 2011-04-25 (×4): qty 1

## 2011-04-25 MED ORDER — LIDOCAINE HCL (PF) 1 % IJ SOLN
INTRAMUSCULAR | Status: AC
  Filled 2011-04-25: qty 30

## 2011-04-25 MED ORDER — HEPARIN SODIUM (PORCINE) 1000 UNIT/ML IJ SOLN
INTRAMUSCULAR | Status: AC
  Filled 2011-04-25: qty 1

## 2011-04-25 MED ORDER — THERA M PLUS PO TABS
1.0000 | ORAL_TABLET | Freq: Every day | ORAL | Status: DC
  Administered 2011-04-25 – 2011-05-01 (×7): 1 via ORAL
  Filled 2011-04-25 (×7): qty 1

## 2011-04-25 MED ORDER — NITROGLYCERIN 0.2 MG/ML ON CALL CATH LAB
INTRAVENOUS | Status: AC
  Filled 2011-04-25: qty 1

## 2011-04-25 MED ORDER — HEPARIN SODIUM (PORCINE) 5000 UNIT/ML IJ SOLN
5000.0000 [IU] | Freq: Three times a day (TID) | INTRAMUSCULAR | Status: DC
  Administered 2011-04-25 – 2011-04-26 (×2): 5000 [IU] via SUBCUTANEOUS
  Filled 2011-04-25 (×5): qty 1

## 2011-04-25 MED ORDER — HYDROCHLOROTHIAZIDE 25 MG PO TABS
25.0000 mg | ORAL_TABLET | Freq: Every day | ORAL | Status: DC
  Administered 2011-04-25 – 2011-04-26 (×2): 25 mg via ORAL
  Filled 2011-04-25 (×4): qty 1

## 2011-04-25 MED ORDER — NITROGLYCERIN 0.4 MG SL SUBL: 0.4000 mg | SUBLINGUAL_TABLET | SUBLINGUAL | Status: DC | PRN

## 2011-04-25 MED ORDER — SODIUM CHLORIDE 0.9 % IJ SOLN
3.0000 mL | Freq: Two times a day (BID) | INTRAMUSCULAR | Status: DC
  Administered 2011-04-25 – 2011-05-01 (×8): 3 mL via INTRAVENOUS

## 2011-04-25 MED ORDER — METOPROLOL TARTRATE 12.5 MG HALF TABLET
12.5000 mg | ORAL_TABLET | Freq: Two times a day (BID) | ORAL | Status: DC
  Administered 2011-04-25 – 2011-04-26 (×2): 12.5 mg via ORAL
  Filled 2011-04-25 (×3): qty 1

## 2011-04-25 MED ORDER — ALBUTEROL SULFATE HFA 108 (90 BASE) MCG/ACT IN AERS
2.0000 | INHALATION_SPRAY | RESPIRATORY_TRACT | Status: DC | PRN
  Filled 2011-04-25: qty 6.7

## 2011-04-25 MED ORDER — VERAPAMIL HCL 2.5 MG/ML IV SOLN
INTRAVENOUS | Status: AC
  Filled 2011-04-25: qty 2

## 2011-04-25 MED ORDER — ACETAMINOPHEN 325 MG PO TABS: 650.0000 mg | ORAL_TABLET | ORAL | Status: DC | PRN

## 2011-04-25 MED ORDER — ONDANSETRON HCL 4 MG/2ML IJ SOLN: 4.0000 mg | Freq: Four times a day (QID) | INTRAMUSCULAR | Status: DC | PRN

## 2011-04-25 MED ORDER — SODIUM CHLORIDE 0.9 % IV SOLN: 250.0000 mL | INTRAVENOUS | Status: DC | PRN

## 2011-04-25 MED ORDER — FENTANYL CITRATE 0.05 MG/ML IJ SOLN
INTRAMUSCULAR | Status: AC
  Filled 2011-04-25: qty 2

## 2011-04-25 MED ORDER — ASPIRIN 81 MG PO CHEW
CHEWABLE_TABLET | ORAL | Status: AC
  Administered 2011-04-25: 14:00:00
  Filled 2011-04-25: qty 4

## 2011-04-25 MED ORDER — MIDAZOLAM HCL 2 MG/2ML IJ SOLN
INTRAMUSCULAR | Status: AC
  Filled 2011-04-25: qty 2

## 2011-04-25 MED ORDER — ASPIRIN EC 81 MG PO TBEC
81.0000 mg | DELAYED_RELEASE_TABLET | Freq: Every day | ORAL | Status: DC
  Administered 2011-04-26 – 2011-05-01 (×6): 81 mg via ORAL
  Filled 2011-04-25 (×6): qty 1

## 2011-04-25 MED ORDER — NON FORMULARY: 2.0000 | Status: DC | PRN

## 2011-04-25 NOTE — Progress Notes (Signed)
CRITICAL VALUE ALERT  Critical value received:  CKMB - 154.4, Troponin - >25  Date of notification:  04/25/2011    Time of notification:  1858   Critical value read back:yes  Nurse who received alert:  Eliane Decree, RN   MD notified (1st page):  Ward Givens, NP    Time of first page:  1910   MD notified (2nd page):  Time of second page:  Responding MD:  Brion Aliment, NP  Time MD responded:  [phone]

## 2011-04-25 NOTE — H&P (Signed)
Patient ID: [name] MRN: [id number], DOB/AGE: Oct 17, 1940   Admit date: 04/25/2011   Primary Physician: Hoyle Sauer, MD, MD Primary Cardiologist: Maurine Cane  Pt. Profile:   70 y/o male with prior history of CAD and LAD stenting who presents with OOH MI and ongoing Inferior ST elevation.  Problem List: Past Medical History  Diagnosis Date  . COPD (chronic obstructive pulmonary disease)   . Coronary artery disease     nstemi 10/2007 - LAD 95p (stented w/ 2.5 x 12mm Promus DES) , D2 99 small, RCA . - enrolled in Tracer Study  . Gout   . Hyperlipidemia   . Tobacco abuse     Cigarettes x 30 yrs; now occasional cigars.  . Hypertension   . Obesity   . Diverticulosis   . Asthma   . GERD (gastroesophageal reflux disease)   . Hemorrhoids   . Obstructive sleep apnea   . Osteoarthritis     Past Surgical History  Procedure Date  . Appendectomy   . Tonsillectomy   . Knee arthroscopy     Right     Allergies:  Allergies  Allergen Reactions  . Tiotropium Bromide Monohydrate     REACTION: constipation    HPI:   69 y/o male with the above problem list.  He is s/p DES - LAD in 2009 in the setting of a nstemi.  He was enrolled in the tracer study @ that time.  He has done well over the years, however yesterday, while working on a car, developed severe sscp, which persisted at a tolerable level for the remainder of the day.  He thinks the discomfort finally eased off before bed last night and he slept well.  This am, he again noted discomfort and presented to his PCP, who obtained an EKG, which showed inferior ST elevation and sent the patient to the ED.  In the ED, a code STEMI was called 2/2 ongoing chest discomfort and inferior ST elevation.  Pt was taken to the cath lab emergently.  Currently he reports minimal chest discomfort.   Home Medications  ASA 81MG  DAILY SIMVASTATIN 40MG  1/2 TAB DAILY LISINOPRIL-HCTZ 20/12.5MG  DAILY MVI 1 DAILY ALBUTEROL MDI 2  PUFFS Q4H PRN SYMBICORT 2 PUFFS BID CO Q Q10 DAILY VIT D DAILY   Family History  Problem Relation Age of Onset  . Heart attack Father     died @ 67  . Heart failure Father      History   Social History  . Marital Status: Married    Spouse Name: N/A    Number of Children: N/A  . Years of Education: N/A   Occupational History  .  Retired Occupational hygienist     Social History Main Topics  . Smoking status: Current Everyday Smoker -- 30 years    Types: Cigars  . Smokeless tobacco: Not on file   Comment: pt has 30 pack yr history of cigarette use; now smoking occasional cigar.  . Alcohol Use: No  . Drug Use: No  . Sexually Active: Not on file   Other Topics Concern  . Not on file   Social History Narrative        Review of Systems: General: negative for chills, fever, night sweats or weight changes.  Cardiovascular: ++ for chest pain and dyspnea yesterday and this am.  No edema, orthopnea, palpitations, paroxysmal nocturnal dyspnea or shortness of breath Dermatological: negative for rash Respiratory: negative for cough or wheezing Urologic: negative for hematuria  Abdominal: negative for nausea, vomiting, diarrhea, bright red blood per rectum, melena, or hematemesis Neurologic: negative for visual changes, syncope, or dizziness All other systems reviewed and are otherwise negative except as noted above.  Physical Exam: Blood pressure 138/84, pulse 78, temperature 98.2 F (36.8 C), temperature source Oral, resp. rate 14, SpO2 97.00%.  General: Well developed, well nourished, in no acute distress. Head: Normocephalic, atraumatic, sclera non-icteric, no xanthomas, nares are without discharge.  Neck: Supple without bruits or JVD. Lungs:  Resp regular and unlabored, CTA. Heart: RRR no s3, s4, or murmurs. Abdomen: Soft, non-tender, non-distended, BS + x 4.  Msk:  Strength and tone appears normal for age. Extremities: No clubbing, cyanosis or edema. DP/PT/Radials 2+ and  equal bilaterally. Neuro: Alert and oriented X 3. Moves all extremities spontaneously. Psych: Normal affect.   Labs:   Results for orders placed during the hospital encounter of 04/25/11 (from the past 72 hour(s))  POCT I-STAT, CHEM 8     Status: Abnormal   Collection Time   04/25/11  2:05 PM      Component Value Range Comment   Sodium 137  135 - 145 (mEq/L)    Potassium 3.9  3.5 - 5.1 (mEq/L)    Chloride 96  96 - 112 (mEq/L)    BUN 10  6 - 23 (mg/dL)    Creatinine, Ser 1.61  0.50 - 1.35 (mg/dL)    Glucose, Bld 096 (*) 70 - 99 (mg/dL)    Calcium, Ion 0.45 (*) 1.12 - 1.32 (mmol/L)    TCO2 31  0 - 100 (mmol/L)    Hemoglobin 17.0  13.0 - 17.0 (g/dL)    HCT 40.9  81.1 - 91.4 (%)      Radiology/Studies: No results found.  EKG:  RSR, 68, Inf q's w/ 1mm inf ST elevation with slight elevation in V4-V6 and TWI V4-V6  ASSESSMENT AND PLAN:   1.  Out of Hospital MI/CAD:  Pt presents with a 1 day history of chest discomfort that started nearly 24 hrs ago.  EKG cont to show inferior ST elevation with Q's.  Pt has minimal chest discomfort at this point.  Diagnostic cath has shown an occluded RCA with patent LAD stent.  Films reviewed and plan is for medical therapy.  Will review home meds and plan to use asa, bb, statin, plavix.  2.  HTN:  Currently stable.  Takes Lisinopril-HCTZ @ home.  3.  HL:  Check lipids/lft's.  On simva 20 @ home.  4.  Tobacco abuse/COPD:  Will need formal cessation counseling.  Cont home inhalers.   Signed, Nicolasa Ducking, NP 04/25/2011, 2:24 PM

## 2011-04-25 NOTE — Op Note (Signed)
Cardiac Catheterization Procedure Note  Name: [name] MRN: [id number] DOB: May 17, 1940  Procedure: Left Heart Cath, Selective Coronary Angiography, LV angiography  Indication: 70 year old white male with history of coronary disease and prior stenting of the LAD who presents with evidence of recent inferior myocardial infarction by ECG. ECG shows Q waves with some residual ST elevation in the inferior leads. Patient is currently pain-free.   Procedural Details: The right wrist was prepped, draped, and anesthetized with 1% lidocaine. Using the modified Seldinger technique, a 6 French sheath was introduced into the right radial artery. 3 mg of verapamil was administered through the sheath, weight-based unfractionated heparin was administered intravenously. Standard Judkins catheters were used for selective coronary angiography and left ventriculography. Catheter exchanges were performed over an exchange length guidewire. There were no immediate procedural complications. A TR band was used for radial hemostasis at the completion of the procedure.  The patient was transferred to the post catheterization recovery area for further monitoring.  Procedural Findings: Hemodynamics: AO 128/81 with a mean of 103 mmHg LV 124/ 20  Coronary angiography: Coronary dominance: right  Left mainstem: Minor irregularities less than 10%.  Left anterior descending (LAD): There is a stent in the proximal vessel which is widely patent. In the mid vessel there is a 40% focal stenosis.  Left circumflex (LCx): This is a large vessel giving rise to 2 large marginal branches. There is scattered mild disease up to 20%.  Right coronary artery (RCA): This is a dominant vessel. It is abruptly occluded in the proximal vessel. No collateral flow is seen to the right coronary.  Left ventriculography: Left ventricular systolic function is abnormal. There is severe diffuse inferior wall hypokinesis. Overall ejection  fraction is estimated at 40-45%.   Final Conclusions:   1. Single vessel occlusive coronary disease with occlusion of the proximal right coronary. Prior stent in the proximal LAD is widely patent. 2. Moderate left and trigger dysfunction with inferior wall motion abnormality.  Recommendations: Patient had onset of pain over 24 hours ago. His ECG and cardiac catheterization findings suggested he has had a completed inferior myocardial infarction. He is currently pain-free. We will manage him medically.  Peter [place] 04/25/2011, 2:38 PM

## 2011-04-25 NOTE — ED Notes (Signed)
MD at bedside. 

## 2011-04-25 NOTE — H&P (View-Only) (Signed)
Chaplain's Note:  Met pt's wife in the waiting room.  Offered refreshment and emotional support.  Offered to help her contact family and pastor.  Ended the visit with prayer.  Please page if needed or requested. Braylan Faul  [phone] 

## 2011-04-25 NOTE — ED Provider Notes (Signed)
History     CSN: [id number] Arrival date & time: 04/25/2011  1:15 PM   First MD Initiated Contact with Patient 04/25/11 1325      Chief Complaint  Patient presents with  . Abnormal ECG    (Consider location/radiation/quality/duration/timing/severity/associated sxs/prior treatment) Patient is a 70 y.o. male presenting with chest pain. The history is provided by the patient (H. that he has been having chest pain the weekend patient was seen over at the doctor's office today dr Felipa Eth.  pt had changes on ekg). No language interpreter was used.  Chest Pain The chest pain began 2 days ago. Chest pain occurs frequently. The chest pain is unchanged. The pain is associated with stress. At its most intense, the pain is at 5/10. The severity of the pain is severe. The quality of the pain is described as aching. The pain radiates to the epigastrium. Chest pain is worsened by certain positions. Primary symptoms include shortness of breath. Pertinent negatives for primary symptoms include no fever, no fatigue, no syncope, no cough, no abdominal pain and no nausea.  Pertinent negatives for associated symptoms include no diaphoresis and no orthopnea. He tried nothing for the symptoms. Risk factors include no known risk factors.  His past medical history is significant for CAD and MI.  Pertinent negatives for past medical history include no aortic aneurysm, no arrhythmia and no seizures.  His family medical history is significant for CAD in family.  Procedure history is positive for cardiac catheterization.     Past Medical History  Diagnosis Date  . COPD (chronic obstructive pulmonary disease)   . Coronary artery disease   . Gout   . Hyperlipidemia     History reviewed. No pertinent past surgical history.  No family history on file.  History  Substance Use Topics  . Smoking status: Not on file  . Smokeless tobacco: Not on file  . Alcohol Use:       Review of Systems  Constitutional:  Negative for fever, diaphoresis and fatigue.  HENT: Negative for congestion, sinus pressure and ear discharge.   Eyes: Negative for discharge.  Respiratory: Positive for shortness of breath. Negative for cough.   Cardiovascular: Positive for chest pain. Negative for orthopnea and syncope.  Gastrointestinal: Negative for nausea, abdominal pain and diarrhea.  Genitourinary: Negative for frequency and hematuria.  Musculoskeletal: Negative for back pain.  Skin: Negative for rash.  Neurological: Negative for seizures and headaches.  Hematological: Negative.   Psychiatric/Behavioral: Negative for hallucinations.    Allergies  Tiotropium bromide monohydrate  Home Medications  No current outpatient prescriptions on file.  BP 138/84  Pulse 78  Temp(Src) 98.2 F (36.8 C) (Oral)  Resp 14  SpO2 97%  Physical Exam  Constitutional: He is oriented to person, place, and time. He appears well-developed.  HENT:  Head: Normocephalic and atraumatic.  Eyes: Conjunctivae and EOM are normal. No scleral icterus.  Neck: Neck supple. No thyromegaly present.  Cardiovascular: Normal rate and regular rhythm.  Exam reveals no gallop and no friction rub.   No murmur heard. Pulmonary/Chest: No stridor. He has no wheezes. He has no rales. He exhibits no tenderness.  Abdominal: He exhibits no distension. There is no tenderness. There is no rebound.  Musculoskeletal: Normal range of motion. He exhibits no edema.  Lymphadenopathy:    He has no cervical adenopathy.  Neurological: He is oriented to person, place, and time. Coordination normal.  Skin: No rash noted. No erythema.  Psychiatric: He has a normal mood  and affect. His behavior is normal.    ED Course  Procedures (including critical care time)  Labs Reviewed - No data to display No results found.   1. MI (myocardial infarction)      Date: 04/25/2011  Rate: 68  Rhythm: normal sinus rhythm  QRS Axis: normal  Left axis deviation   Intervals: normal  ST/T Wave abnormalities: ST elevations inferiorly  Conduction Disutrbances:none  Narrative Interpretation:   Old EKG Reviewed: changes noted    MDM          Benny Lennert, MD 04/25/11 1353

## 2011-04-25 NOTE — Progress Notes (Signed)
Chaplain's Note:  Met pt's wife in the waiting room.  Offered refreshment and emotional support.  Offered to help her contact family and pastor.  Ended the visit with prayer.  Please page if needed or requested. [employer]  [phone]

## 2011-04-25 NOTE — Interval H&P Note (Signed)
History and Physical Interval Note:  04/25/2011 2:32 PM  [name]  has presented today for surgery, with the diagnosis of stemi  The various methods of treatment have been discussed with the patient and family. After consideration of risks, benefits and other options for treatment, the patient has consented to  Procedure(s): LEFT HEART CATHETERIZATION WITH CORONARY ANGIOGRAM as a surgical intervention .  The patients' history has been reviewed, patient examined, no change in status, stable for surgery.  I have reviewed the patients' chart and labs.  Questions were answered to the patient's satisfaction.     Theron Arista [place]

## 2011-04-25 NOTE — ED Notes (Signed)
Sent by [facility]. Pt reports chest pain intermittantly this week. Went to PCP this am for check up. New onset a-fib on their EKG. Sent over for eval. Denies cp today

## 2011-04-25 NOTE — ED Notes (Signed)
ekg given to dr. Estell Harpin

## 2011-04-26 ENCOUNTER — Inpatient Hospital Stay (HOSPITAL_COMMUNITY): Payer: Medicare Other

## 2011-04-26 DIAGNOSIS — I219 Acute myocardial infarction, unspecified: Secondary | ICD-10-CM

## 2011-04-26 DIAGNOSIS — I519 Heart disease, unspecified: Secondary | ICD-10-CM

## 2011-04-26 DIAGNOSIS — I2510 Atherosclerotic heart disease of native coronary artery without angina pectoris: Secondary | ICD-10-CM

## 2011-04-26 LAB — BASIC METABOLIC PANEL
BUN: 9 mg/dL (ref 6–23)
Creatinine, Ser: 0.64 mg/dL (ref 0.50–1.35)
GFR calc Af Amer: >90 mL/min (ref >90)
GFR calc non Af Amer: >90 mL/min (ref >90)

## 2011-04-26 LAB — CARDIAC PANEL(CRET KIN+CKTOT+MB+TROPI)
CK, MB: 89.3 ng/mL (ref 0.3–4.0)
Total CK: 1459 U/L — ABNORMAL HIGH (ref 7–232)

## 2011-04-26 LAB — CBC
HCT: 45.7 % (ref 39.0–52.0)
MCHC: 33 g/dL (ref 30.0–36.0)
RDW: 14.2 % (ref 11.5–15.5)

## 2011-04-26 LAB — TSH: TSH: 1.044 u[IU]/mL (ref 0.350–4.500)

## 2011-04-26 LAB — LIPID PANEL
Cholesterol: 134 mg/dL (ref 0–200)
VLDL: 12 mg/dL (ref 0–40)

## 2011-04-26 LAB — HEMOGLOBIN A1C: Mean Plasma Glucose: 128 mg/dL — ABNORMAL HIGH (ref <117)

## 2011-04-26 MED ORDER — METOPROLOL TARTRATE 25 MG PO TABS
25.0000 mg | ORAL_TABLET | Freq: Two times a day (BID) | ORAL | Status: DC
  Filled 2011-04-26 (×3): qty 1

## 2011-04-26 NOTE — Progress Notes (Signed)
CARDIAC REHAB PHASE I   PRE:  Rate/Rhythm: 78 SR  BP:  Supine:   Sitting: 126/81  Standing:    SaO2: 94 2L 92 RA  MODE:  Ambulation: 350 ft   POST:  Rate/Rhythem: 86 SR  BP:  Supine:   Sitting: 131/68  Standing:    SaO2: 91 RA 1000- 1050 On arrival found pt out of bed holding to the side of bed states he was going to the bathroom. He had not called for help. Reinforced to  him, to call for assistance when he wanted to go to bathroom. Pt is very reluctant to accept help, feels he can do it on his own. Assisted X 1 and used walker to ambulate. He is wobbly with out walker. Fairly steady with walker , he gets it to far in front of him and wants to lean on it without holding on to it.He does c/o of weakness,no cp or SOB. To side of bed after walk with visitors present and call light in reach. Did start MI education with pt and discussed Oupt. CRP with him, he is thinking about it.     Beatrix Fetters

## 2011-04-26 NOTE — Progress Notes (Signed)
Pt has been smoking from 0-4 cigarellus per day. He says he won't be smoking any more and will quit on his own. Pt in action stage. Encouraged to quit. Referred to 1-800 quit now for f/u and support. Discussed oral fixation substitutes, second hand smoke and in home smoking policy. Reviewed and gave pt Written education/contact information.

## 2011-04-26 NOTE — Progress Notes (Signed)
Pt had 6 beats of VTACH, asymptomatic. I was in the room with him while this occurred and he was just laying in bed. Denies pain. Will continue to monitor

## 2011-04-26 NOTE — Progress Notes (Signed)
Patient ID: [name], male   DOB: 1940/08/11, 70 y.o.   MRN: [id number] TELEMETRY: Reviewed telemetry pt in NSR with occ PAC: Filed Vitals:   04/26/11 0440 04/26/11 0500 04/26/11 0600 04/26/11 0700  BP: 131/85 126/83 115/84 131/77  Pulse: 74 77 65 66  Temp:      TempSrc:      Resp:      Height:      Weight:  128.3 kg (282 lb 13.6 oz)    SpO2: 96% 96% 95% 96%    Intake/Output Summary (Last 24 hours) at 04/26/11 0728 Last data filed at 04/26/11 0000  Gross per 24 hour  Intake 546.25 ml  Output    300 ml  Net 246.25 ml    SUBJECTIVE Denies any further chest pain or SOB. Tried to get up to BR last night but fell trying to get untangled from cords. No injury. Complains of increased weakness over the past couple of years. Doesn't exercise regularly.  LABS: Basic Metabolic Panel:  Basename 04/26/11 0502 04/25/11 1740  NA 135 137  K 3.8 4.1  CL 97 96  CO2 31 28  GLUCOSE 120* 119*  BUN 9 10  CREATININE 0.64 0.62  CALCIUM 9.2 9.3  MG -- 2.1  PHOS -- --   Liver Function Tests:  [facility] 04/25/11 1740  AST 242*  ALT 54*  ALKPHOS 91  BILITOT 0.8  PROT 6.8  ALBUMIN 3.7   No results found for this basename: LIPASE:2,AMYLASE:2 in the last 72 hours CBC:  Basename 04/26/11 0502 04/25/11 1405  WBC 13.4* --  NEUTROABS -- --  HGB 15.1 17.0  HCT 45.7 50.0  MCV 91.0 --  PLT 202 --   Cardiac Enzymes:  Basename 04/26/11 0007 04/25/11 1741  CKTOTAL 1459* 1779*  CKMB 89.3* 154.4*  CKMBINDEX -- --  TROPONINI >25.00* >25.00*   BNP: No components found with this basename: POCBNP:3 D-Dimer: No results found for this basename: DDIMER:2 in the last 72 hours Hemoglobin A1C: No results found for this basename: HGBA1C in the last 72 hours Fasting Lipid Panel:  Basename 04/26/11 0502  CHOL 134  HDL 52  LDLCALC 70  TRIG 60  CHOLHDL 2.6  LDLDIRECT --   Thyroid Function Tests:  Basename 04/25/11 1740  TSH 1.044  T4TOTAL --  T3FREE --  THYROIDAB --   Anemia  Panel: No results found for this basename: VITAMINB12,FOLATE,FERRITIN,TIBC,IRON,RETICCTPCT in the last 72 hours  Radiology/Studies:  No results found.  PHYSICAL EXAM General: Well developed, obese, in no acute distress. Head: Normocephalic, atraumatic, sclera non-icteric, no xanthomas, nares are without discharge. Neck: Negative for carotid bruits. JVD not elevated. Lungs: Clear bilaterally to auscultation without wheezes, rales, or rhonchi. Breathing is unlabored. Heart: RRR S1 S2 without murmurs, rubs, or gallops.  Abdomen: Soft, non-tender, non-distended with normoactive bowel sounds. No hepatomegaly. No rebound/guarding. No obvious abdominal masses. Msk:  Strength and tone appears normal for age. Extremities: No clubbing, cyanosis or edema.  Distal pedal pulses are 2+ and equal bilaterally. Neuro: Alert and oriented X 3. Moves all extremities spontaneously. Psych:  Responds to questions appropriately with a normal affect.  ASSESSMENT AND PLAN: 1. Subacute inferior myocardial infarction with occluded RCA. Ecg showed Q waves on admission. Pain started 24 hrs before. Enzymes significantly elevated on admission and declining. No recurrent chest pain. 2. Hyperlipidemia. Lipid panel looks favorable on statin Rx. 3. Obesity 4. Tobacco abuse- cigars.  5. Htn controlled.  Will transfer to telemetry today. Advance activity with cardiac  Rehab. Smoking cessation counseling Check Echo today. Continue ACEi and increase metoprolol.    Principal Problem:  *Inferior myocardial infarction Active Problems:  HYPERLIPIDEMIA-MIXED  Overweight  HYPERTENSION, BORDERLINE  COPD  SLEEP APNEA  Tobacco abuse  Coronary artery disease    Signed, Jaskirat Schwieger [place] MD, [facility]

## 2011-04-26 NOTE — Progress Notes (Signed)
Echocardiogram 2D Echocardiogram has been performed.  Katheren Puller 04/26/2011, 4:57 PM

## 2011-04-26 NOTE — Plan of Care (Signed)
Problem: Phase I Progression Outcomes Goal: Other Phase I Outcomes/Goals Outcome: Progressing Patient has remained free from injury during the course of the shift and also remained free from any respiratory or cardiac distress during the course of the shift.

## 2011-04-26 NOTE — Plan of Care (Signed)
Problem: Phase I Progression Outcomes Goal: Anginal pain relieved Outcome: Progressing Patient has denied pain during the course of the shift

## 2011-04-26 NOTE — Progress Notes (Signed)
Patient has remained free from injury during the course of the shift, RA and denies any pain at this time.  Patient is currently resting in bed, call bell in reach and side rails in an upright position.  Wife is at bedside.  Patient denies any verbal complaints or requests at this time.  Will continue to monitor patient during the remainder of the shift.

## 2011-04-27 ENCOUNTER — Other Ambulatory Visit: Payer: Self-pay

## 2011-04-27 LAB — DIFFERENTIAL
Lymphocytes Relative: 16 % (ref 12–46)
Lymphs Abs: 2.1 10*3/uL (ref 0.7–4.0)
Monocytes Absolute: 1.7 10*3/uL — ABNORMAL HIGH (ref 0.1–1.0)
Monocytes Relative: 13 % — ABNORMAL HIGH (ref 3–12)
Neutro Abs: 9.5 10*3/uL — ABNORMAL HIGH (ref 1.7–7.7)

## 2011-04-27 LAB — CBC
HCT: 44.6 % (ref 39.0–52.0)
Hemoglobin: 15 g/dL (ref 13.0–17.0)
MCHC: 33.6 g/dL (ref 30.0–36.0)
RBC: 4.9 MIL/uL (ref 4.22–5.81)
WBC: 13.4 10*3/uL — ABNORMAL HIGH (ref 4.0–10.5)

## 2011-04-27 LAB — BASIC METABOLIC PANEL
BUN: 14 mg/dL (ref 6–23)
Chloride: 98 mEq/L (ref 96–112)
GFR calc Af Amer: >90 mL/min (ref >90)
Potassium: 3.8 mEq/L (ref 3.5–5.1)

## 2011-04-27 MED ORDER — BISOPROLOL FUMARATE 10 MG PO TABS
10.0000 mg | ORAL_TABLET | Freq: Every day | ORAL | Status: DC
  Administered 2011-04-27: 10 mg via ORAL
  Filled 2011-04-27 (×2): qty 1

## 2011-04-27 MED ORDER — MORPHINE SULFATE 2 MG/ML IJ SOLN
2.0000 mg | INTRAMUSCULAR | Status: DC | PRN
  Administered 2011-04-27: 2 mg via INTRAVENOUS

## 2011-04-27 MED ORDER — MORPHINE SULFATE 2 MG/ML IJ SOLN
INTRAMUSCULAR | Status: AC
  Administered 2011-04-27: 2 mg via INTRAVENOUS
  Filled 2011-04-27: qty 1

## 2011-04-27 NOTE — Progress Notes (Signed)
At 1000 am med pass patients blood pressure was 94/60 Dr. [place] notified he stated to hold this am dose of hctz but to give zebeta and lisinopril

## 2011-04-27 NOTE — Progress Notes (Signed)
At 14:30 pt stated his chest pain has resolved

## 2011-04-27 NOTE — Progress Notes (Signed)
Theodore Demark PA paged about ekg. Pt is currently resting peacefully and denies any chest pain. No new orders recieved

## 2011-04-27 NOTE — Progress Notes (Signed)
Patient ID: [name], male   DOB: 30-Apr-1941, 70 y.o.   MRN: [id number] Patient ID: [name], male   DOB: [birth year], 70 y.o.   MRN: [id number] TELEMETRY: Reviewed telemetry pt in NSR with occ PAC: Filed Vitals:   04/26/11 2134 04/26/11 2152 04/26/11 2300 04/27/11 0603  BP: 138/64 97/58 179/81 106/66  Pulse: 73 85 68 73  Temp: 98.7 F (37.1 C)  97.4 F (36.3 C) 99.1 F (37.3 C)  TempSrc: Oral  Oral Oral  Resp: 18  20 18   Height:      Weight:    127.2 kg (280 lb 6.8 oz)  SpO2: 93%  96% 91%    Intake/Output Summary (Last 24 hours) at 04/27/11 0836 Last data filed at 04/26/11 1200  Gross per 24 hour  Intake      3 ml  Output    500 ml  Net   -497 ml    SUBJECTIVE Denies any further chest pain. Does complain of SOB but states this is chronic with COPD. Complains of increased weakness over the past couple of years. Doesn't exercise regularly. Only walked once yesterday.  LABS: Basic Metabolic Panel:  Basename 04/27/11 0525 04/26/11 0502 04/25/11 1740  NA 135 135 --  K 3.8 3.8 --  CL 98 97 --  CO2 27 31 --  GLUCOSE 107* 120* --  BUN 14 9 --  CREATININE 0.81 0.64 --  CALCIUM 8.9 9.2 --  MG -- -- 2.1  PHOS -- -- --   Liver Function Tests:  [facility] 04/25/11 1740  AST 242*  ALT 54*  ALKPHOS 91  BILITOT 0.8  PROT 6.8  ALBUMIN 3.7   No results found for this basename: LIPASE:2,AMYLASE:2 in the last 72 hours CBC:  Basename 04/27/11 0525 04/26/11 0502  WBC 13.4* 13.4*  NEUTROABS 9.5* --  HGB 15.0 15.1  HCT 44.6 45.7  MCV 91.0 91.0  PLT 204 202   Cardiac Enzymes:  Basename 04/26/11 0007 04/25/11 1741  CKTOTAL 1459* 1779*  CKMB 89.3* 154.4*  CKMBINDEX -- --  TROPONINI >25.00* >25.00*   BNP: No components found with this basename: POCBNP:3 D-Dimer: No results found for this basename: DDIMER:2 in the last 72 hours Hemoglobin A1C:  Basename 04/25/11 1740  HGBA1C 6.1*   Fasting Lipid Panel:  Basename 04/26/11 0502  CHOL 134  HDL 52    LDLCALC 70  TRIG 60  CHOLHDL 2.6  LDLDIRECT --   Thyroid Function Tests:  Basename 04/25/11 1740  TSH 1.044  T4TOTAL --  T3FREE --  THYROIDAB --   Anemia Panel: No results found for this basename: VITAMINB12,FOLATE,FERRITIN,TIBC,IRON,RETICCTPCT in the last 72 hours  Radiology/Studies:  CXR shows RLL atx. Otherwise clear.  ECHO shows Inferoseptal akinesis, inferior severe hypokinesis. EF 40-45%. Valves OK.  PHYSICAL EXAM General: Well developed, obese, in no acute distress. Head: Normocephalic, atraumatic, sclera non-icteric, no xanthomas, nares are without discharge. Neck: Negative for carotid bruits. JVD not elevated. Lungs: Bilateral scattered wheezing. Breathing is unlabored. Heart: RRR S1 S2 without murmurs, rubs, or gallops.  Abdomen: Soft, non-tender, non-distended with normoactive bowel sounds. No hepatomegaly. No rebound/guarding. No obvious abdominal masses. Msk:  Strength and tone appears normal for age. Extremities: No clubbing, cyanosis or edema.  Distal pedal pulses are 2+ and equal bilaterally. Neuro: Alert and oriented X 3. Moves all extremities spontaneously. Psych:  Responds to questions appropriately with a normal affect.  ASSESSMENT AND PLAN: 1. Subacute inferior myocardial infarction with occluded RCA. Ecg showed Q waves on  admission. Pain started 24 hrs before. Enzymes significantly elevated on admission and declining. No recurrent chest pain. 2. Hyperlipidemia. Lipid panel looks favorable on statin Rx. 3. Obesity 4. Tobacco abuse- cigars.  5. Htn controlled. 6. COPD/ asthma   Advance activity with cardiac Rehab. Smoking cessation counseling Given wheezing will switch metoprolol to bisoprolol.  Anticipate discharge tomorrow.    Principal Problem:  *Inferior myocardial infarction Active Problems:  HYPERLIPIDEMIA-MIXED  Overweight  HYPERTENSION, BORDERLINE  COPD  SLEEP APNEA  Tobacco abuse  Coronary artery disease    Signed, Peter  [place] MD, [facility]

## 2011-04-27 NOTE — Progress Notes (Signed)
At 1400 patient called out with complaints of chest pain. Pt bp=97/67 hr=60. Pain score of 8 out of 10 2L O2 [place] applied. Called Rhonda Barrett PA new orders received to continue with oxygen therapy and to give morphine. Pt  Reassessed at 1410 pt stated pain 2 out of 10

## 2011-04-27 NOTE — Progress Notes (Signed)
CARDIAC REHAB PHASE I   PRE:  Rate/Rhythm: 83 SR  BP:  Supine:   Sitting: 90/60  Standing:    SaO2: 90 RA  MODE:  Ambulation: 460 ft   POST:  Rate/Rhythem: 90 SR  BP:  Supine:   Sitting: 92/60  Standing:    SaO2: 92 RA 0940-1015 Assisted X 1 and used walker to ambulate. Gait steady with walker. Pt states that he feels stronger today. DOE noted with ambulation, sat after walk 92%. Did try hm without walker, he is much more steady with the walker, would recommend for home use. Pt's wife not here, will return for MI education when she comes. Pt is worried about his BP running to low today.   Beatrix Fetters

## 2011-04-28 ENCOUNTER — Encounter (HOSPITAL_COMMUNITY): Payer: Self-pay | Admitting: Nurse Practitioner

## 2011-04-28 DIAGNOSIS — I441 Atrioventricular block, second degree: Secondary | ICD-10-CM | POA: Insufficient documentation

## 2011-04-28 MED ORDER — EXERCISE FOR HEART AND HEALTH BOOK
Freq: Once | Status: DC
  Filled 2011-04-28: qty 1

## 2011-04-28 MED ORDER — OMEPRAZOLE MAGNESIUM 20 MG PO TBEC: 20.0000 mg | DELAYED_RELEASE_TABLET | Freq: Every day | ORAL | Status: DC

## 2011-04-28 MED ORDER — PRAVASTATIN SODIUM 40 MG PO TABS: 40.0000 mg | ORAL_TABLET | Freq: Every day | ORAL | Status: DC

## 2011-04-28 MED ORDER — NITROGLYCERIN 0.4 MG SL SUBL: 0.4000 mg | SUBLINGUAL_TABLET | SUBLINGUAL | Status: DC | PRN

## 2011-04-28 MED ORDER — AZITHROMYCIN 250 MG PO TABS: ORAL_TABLET | ORAL | Status: DC

## 2011-04-28 MED ORDER — LISINOPRIL 20 MG PO TABS: 20.0000 mg | ORAL_TABLET | Freq: Every day | ORAL | Status: DC

## 2011-04-28 MED ORDER — AZITHROMYCIN 250 MG PO TABS
250.0000 mg | ORAL_TABLET | Freq: Every day | ORAL | Status: DC
  Administered 2011-04-29 – 2011-05-01 (×3): 250 mg via ORAL
  Filled 2011-04-28 (×3): qty 1

## 2011-04-28 MED ORDER — PANTOPRAZOLE SODIUM 40 MG PO TBEC
40.0000 mg | DELAYED_RELEASE_TABLET | Freq: Every day | ORAL | Status: DC
  Administered 2011-04-28 – 2011-04-30 (×3): 40 mg via ORAL
  Filled 2011-04-28 (×3): qty 1

## 2011-04-28 MED ORDER — AZITHROMYCIN 500 MG PO TABS
500.0000 mg | ORAL_TABLET | Freq: Every day | ORAL | Status: AC
  Administered 2011-04-28: 500 mg via ORAL
  Filled 2011-04-28: qty 1

## 2011-04-28 MED ORDER — SODIUM CHLORIDE 0.9 % IV SOLN
INTRAVENOUS | Status: DC
  Administered 2011-04-28 – 2011-04-29 (×2): via INTRAVENOUS

## 2011-04-28 MED ORDER — CLOPIDOGREL BISULFATE 75 MG PO TABS: 75.0000 mg | ORAL_TABLET | Freq: Every day | ORAL | Status: DC

## 2011-04-28 NOTE — Progress Notes (Signed)
Met with patient's wife since he was asleep. Discussed obtaining a rolling walker and wife refused this service. She stated that the patient will not use it. I will reassess again once patient is awake.

## 2011-04-28 NOTE — Discharge Summary (Signed)
Patient ID: [name],  MRN: [id number], DOB/AGE: 06-16-1940 70 y.o.  Admit date: 04/25/2011 Discharge date: 05/01/2011  Primary Care Provider: Dr. Felipa Eth Primary Cardiologist: Dr. Eden Emms  Discharge Diagnoses Principal Problem:  *Out of Hospital Inferior myocardial infarction Active Problems:  Mobitz type I Wenckebach atrioventricular block  HYPERLIPIDEMIA-MIXED  HYPERTENSION, BORDERLINE  COPD  Tobacco abuse  Overweight  Acute bronchitis - resolved  Coronary artery disease  Atrial Fibrillation  Acute Systolic CHF  Allergies Allergies  Allergen Reactions  . Tiotropium Bromide Monohydrate     REACTION: constipation    Procedures  04/25/2011 - Cardiac Catheterization  Coronary angiography: Coronary dominance: right  Left mainstem: Minor irregularities less than 10%.  Left anterior descending (LAD): There is a stent in the proximal vessel which is widely patent. In the mid vessel there is a 40% focal stenosis.  Left circumflex (LCx): This is a large vessel giving rise to 2 large marginal branches. There is scattered mild disease up to 20%.  Right coronary artery (RCA): This is a dominant vessel. It is abruptly occluded in the proximal vessel. No collateral flow is seen to the right coronary.  Left ventriculography: Left ventricular systolic function is abnormal. There is severe diffuse inferior wall hypokinesis. Overall ejection fraction is estimated at 40-45%.   Final Conclusions:   1. Single vessel occlusive coronary disease with occlusion of the proximal right coronary. Prior stent in the proximal LAD is widely patent. 2. Moderate left and trigger dysfunction with inferior wall motion abnormality.  Recommendations: Patient had onset of pain over 24 hours ago. His ECG and cardiac catheterization findings suggested he has had a completed inferior myocardial infarction. He is currently pain-free. We will manage him medically.  04/26/2011 2D  Echocardiogram  Study Conclusions  - Left ventricle: The cavity size was normal. Wall thickness   was normal. Systolic function was mildly to moderately   reduced. The estimated ejection fraction was in the range   of 40% to 45%. Akinesis of the inferoseptal myocardium.   Severe hypokinesis of the inferior myocardium. - Left atrium: The atrium was mildly dilated.  04/26/2011 - Chest X-Ray  IMPRESSION:  1.  Right lung base atelectasis  History of Present Illness  70 y/o male with prior history CAD s/p LAD stenting in 2009 who was in his usoh until the day prior to admission when he began to experience severe substernal chest pain which persisted throughout the day.  He had recurrent symptoms on the morning of admission prompting him to see his PCP.  He had an ekg performed showing inferior q's with st elevation and patient was referred to the [facility] ED.  In the ED, a Code STEMI was called given ongoing st elevation, though pt was relatively pain free.  He was taken to the [facility] cath lab for emergent cath.  Hospital Course   Emergent catheterization revealed a totally occluded RCA with a patent LAD stent.  Secondary to pts late presentation, it was felt that medical therapy was warranted.  Therefor, he was taken to the coronary intensive care unit for monitoring and placed on ASA, Plavix, beta-blocker, ace inhibitor, and statin therapy.  He eventually peaked his CK @ 1779, MB @ 154.4, and Troponin I @ >25.  A 2D echo was carried out on 04/26/11 revealing an EF of 40-45% with severe inferior HK and inferoseptal AK.   Pt was transferred out to the floor and has been ambulating with cardiac rehab.  An outpt cardiac rehab referral has  been made.  He has been noted to become bradycardic on BB therapy and was initially switched from metoprolol to bisoprolol but then, in addition to bradycardia (high 40's-50's), pt developed a wide first degree avb ( ) and Mobitz I (Wenckebach) heart block  on 12/14.  Secondary to these findings, pts beta blocker was discontinued altogether and plans for discharge were put on hold.  He remained asymptomatic despite HR's dipping into the high 20's @ times, and he always had appropriate chronotropic response to activity.   On 12/15, pt was noted to exhibit intermittent atrial fibrillation/flutter, with rates as low as the 30's.  Further, he was noted to be hypoxic and his BP remained soft.  He was seen by Electrophysiology with recommendation for DCCV if he didn't convert back to sinus.  Because of bradycardia, no rate controlling agents were necessary.  Further, in light of recent MI with dual anti-platelet agents in use, we have forgone coumadin anticoagulation at this time.  Pt was felt to have mild volume overload on exam and he was diuresed with good response and we plan to send pt home with lasix 20mg  po daily.  By 12/16, pts heart rate stabilized in the 70's - 80's.  He did revert to sinus rhythm but has continued to have intermittent afib/flutter, which has been of normal rate.  He has not been symptomatic.  Pt will be d/c'd today in good condition and we will arrange for a 30 day event monitor with a.fib detection.  Of note, pt was treated for productive cough and presumed acute bronchitis with a 5 day course of azithromycin during this admission.  Symptoms of bronchitis have resolved and he has completed his antibiotic course.  Discharge Vitals:  Blood pressure 98/56, pulse 58, temperature 98 F (36.7 C), temperature source Oral, resp. rate 18, height 6\' 1"  (1.854 m), weight 285 lb 0.9 oz (129.3 kg), SpO2 96.00%.   Labs: CBC:  Basename 04/29/11 0600  WBC 10.6*  NEUTROABS --  HGB 14.2  HCT 43.1  MCV 91.7  PLT 223   Basic Metabolic Panel:  Basename 05/01/11 0600 04/29/11 0600  NA 138 135  K 3.4* 4.0  CL 98 99  CO2 30 26  GLUCOSE 107* 120*  BUN 17 23  CREATININE 0.90 0.89  CALCIUM 9.1 8.5  MG -- --  PHOS -- --   Lab Results   Component Value Date   CKTOTAL 290* 04/27/2011   CKMB 7.8* 04/27/2011   TROPONINI 7.98* 04/27/2011   Lab Results  Component Value Date   ALT 54* 04/25/2011   AST 242* 04/25/2011   ALKPHOS 91 04/25/2011   BILITOT 0.8 04/25/2011     Disposition:  Follow-up Information    Follow up with Hoyle Sauer, MD. (As needed)       Follow up with Tereso Newcomer, PA on 05/19/2011. (9:30)    Contact information:   [address] [place] [address] [phone]       Follow up with [facility]. (we will contact you for event monitor setup)    Contact information:   [address] [place] [address]          Discharge Medications: Current Discharge Medication List    START taking these medications   Details  clopidogrel (PLAVIX) 75 MG tablet Take 1 tablet (75 mg total) by mouth daily with breakfast. Qty: 30 tablet, Refills: 6    furosemide (LASIX) 20 MG tablet Take 1 tablet (  20 mg total) by mouth daily. Qty: 30 tablet, Refills: 6    lisinopril (PRINIVIL,ZESTRIL) 10 MG tablet Take 1 tablet (10 mg total) by mouth daily. Qty: 30 tablet, Refills: 6    nitroGLYCERIN (NITROSTAT) 0.4 MG SL tablet Place 1 tablet (0.4 mg total) under the tongue every 5 (five) minutes as needed for chest pain. Qty: 25 tablet, Refills: 3    omeprazole (PRILOSEC OTC) 20 MG tablet Take 1 tablet (20 mg total) by mouth daily.    potassium chloride (K-DUR) 10 MEQ tablet Take 1 tablet (10 mEq total) by mouth daily. Qty: 30 tablet, Refills: 6    pravastatin (PRAVACHOL) 40 MG tablet Take 1 tablet (40 mg total) by mouth daily. Qty: 30 tablet, Refills: 6      CONTINUE these medications which have NOT CHANGED   Details  albuterol (PROVENTIL HFA;VENTOLIN HFA) 108 (90 BASE) MCG/ACT inhaler Inhale 1 puff into the lungs every 6 (six) hours as needed. For shortness of breath.     aspirin 81 MG chewable tablet Chew 81 mg by mouth daily.       budesonide-formoterol (SYMBICORT) 160-4.5 MCG/ACT inhaler Inhale 2 puffs into the lungs 2 (two) times daily.      Cholecalciferol (VITAMIN D) 2000 UNITS CAPS Take 1 capsule by mouth daily.      Coenzyme Q10 (CO Q-10) 75 MG CAPS Take 1 capsule by mouth daily.      Multiple Vitamins-Minerals (MULTIVITAMINS THER. W/MINERALS) TABS Take 1 tablet by mouth daily.        STOP taking these medications     metoprolol tartrate (LOPRESSOR) 25 MG tablet      rosuvastatin (CRESTOR) 10 MG tablet         Outstanding Labs/Studies  30 day event monitor with A.Fib detection.  Duration of Discharge Encounter:  Greater than 30 minutes including physician time.  Signed, Nicolasa Ducking NP 05/01/2011, 10:50 AM

## 2011-04-28 NOTE — Progress Notes (Signed)
   Patient Name: [name] Date of Encounter: 04/28/2011     Principal Problem:  *Inferior myocardial infarction Active Problems:  Mobitz type I Wenckebach atrioventricular block  HYPERLIPIDEMIA-MIXED  HYPERTENSION, BORDERLINE  COPD  Tobacco abuse  Overweight  Acute bronchitis  Coronary artery disease    SUBJECTIVE:  Called by nurse 2/2 asymptomatic hypotension and bradycardia with rates now dipping into 30's.  Pt is w/o complaints.  When he ambulates, HR rises into the 70's however as soon as he rests, rates decline into the high 30's - 50's.  Pt ambulated w/o difficulty.  BP is currently running in 80's.   CURRENT MEDS    . aspirin EC  81 mg Oral Daily  . azithromycin  500 mg Oral Daily   Followed by  . azithromycin  250 mg Oral Daily  . budesonide-formoterol  2 puff Inhalation BID  . cholecalciferol  1,000 Units Oral Daily  . clopidogrel  75 mg Oral Q breakfast  . excerise for heart and health book   Does not apply Once  . multivitamins ther. w/minerals  1 tablet Oral Daily  . pantoprazole  40 mg Oral Q1200  . rosuvastatin  40 mg Oral q1800  . sodium chloride  3 mL Intravenous Q12H  . DISCONTD: bisoprolol  10 mg Oral Daily  . DISCONTD: hydrochlorothiazide  25 mg Oral Daily  . DISCONTD: lisinopril  20 mg Oral Daily    OBJECTIVE  Filed Vitals:   04/27/11 2059 04/28/11 0600 04/28/11 0948 04/28/11 0953  BP: 91/59 105/73 80/46   Pulse: 69 63    Temp: 98.2 F (36.8 C) 98.3 F (36.8 C)    TempSrc: Oral Oral    Resp: 18 20    Height:      Weight:  285 lb 9.6 oz (129.547 kg)    SpO2: 93% 90%  94%    PHYSICAL EXAM  General: Well developed, well nourished, in no acute distress. Head: Normocephalic, atraumatic, sclera non-icteric, no xanthomas, nares are without discharge.  Neck: Supple without bruits or JVD. Lungs:  Resp regular and unlabored, CTA. Heart: RRR no s3, s4, or murmurs. Abdomen: Soft, non-tender, non-distended, BS + x 4.  Msk:  Strength  and tone appears normal for age. Extremities: No clubbing, cyanosis or edema. DP/PT/Radials 2+ and equal bilaterally. Neuro: Alert and oriented X 3. Moves all extremities spontaneously. Psych: Normal affect.  Radiology/Studies:  Dg Chest 2 View  04/26/2011  *RADIOLOGY REPORT*  Clinical Data: Myocardial infarction.  Weakness.  CHEST - 2 VIEW  Comparison: 11/05/2007  Findings: The heart size appears normal.  No pleural effusion or pulmonary edema.  Atelectasis is identified within the right lung base.  The left lung is clear.  Review of the visualized osseous structures is significant for mild spondylosis.  IMPRESSION:  1.  Right lung base atelectasis.  Original Report Authenticated By: Rosealee Albee, M.D.    ASSESSMENT AND PLAN:  1.  Hypotension:  ? Late manifestation of RV infarct.  Echo suggests NL RV systolic function.  ? If decreased BP a function of bradycardia.  Pt is asymptomatic.  Hydrate (250 bolus followed by 100/hr).  BB and diuretic were already discontinued.  Will d/c ACEI as well.  2.  Bradycardia/Wenckebach:  Asymptomatic .  Beta blocker d/c'd.  Follow closely.  Appropriate chronotropic response with ambulation.    Signed, Nicolasa Ducking NP

## 2011-04-28 NOTE — Plan of Care (Signed)
Problem: Phase I Progression Outcomes Goal: Vascular site scale level 0 - I Vascular Site Scale Level 0: No bruising/bleeding/hematoma Level I (Mild): Bruising/Ecchymosis, minimal bleeding/ooozing, palpable hematoma < 3 cm Level II (Moderate): Bleeding not affecting hemodynamic parameters, pseudoaneurysm, palpable hematoma > 3 cm  Outcome: Completed/Met Date Met:  04/28/11 Right radial

## 2011-04-28 NOTE — Progress Notes (Signed)
Ward Givens, NP called about patients heart rate in the 30's pt is asymptomatic. No new orders received. Will continue to monitor and assess patient.

## 2011-04-28 NOTE — Progress Notes (Addendum)
Patient Name: [name] Date of Encounter: 04/28/2011     Principal Problem:  *Inferior myocardial infarction Active Problems:  HYPERLIPIDEMIA-MIXED  HYPERTENSION, BORDERLINE  Overweight  COPD  SLEEP APNEA  Tobacco abuse  Coronary artery disease    SUBJECTIVE:  Episode of chest pain yesterday.  Resolved and has not recurred.  EKG non-acute.  Ambulating without difficulty.  Notes a productive cough.   CURRENT MEDS    . aspirin EC  81 mg Oral Daily  . bisoprolol  10 mg Oral Daily  . budesonide-formoterol  2 puff Inhalation BID  . cholecalciferol  1,000 Units Oral Daily  . clopidogrel  75 mg Oral Q breakfast  . excerise for heart and health book   Does not apply Once  . hydrochlorothiazide  25 mg Oral Daily  . lisinopril  20 mg Oral Daily  . multivitamins ther. w/minerals  1 tablet Oral Daily  . rosuvastatin  40 mg Oral q1800  . sodium chloride  3 mL Intravenous Q12H  . DISCONTD: metoprolol tartrate  25 mg Oral BID    OBJECTIVE  Filed Vitals:   04/27/11 1407 04/27/11 1941 04/27/11 2059 04/28/11 0600  BP: 97/67  91/59 105/73  Pulse: 64  69 63  Temp: 98.6 F (37 C)  98.2 F (36.8 C) 98.3 F (36.8 C)  TempSrc:   Oral Oral  Resp: 18  18 20   Height:      Weight:    285 lb 9.6 oz (129.547 kg)  SpO2: 96% 93% 93% 90%    Intake/Output Summary (Last 24 hours) at 04/28/11 0831 Last data filed at 04/27/11 1300  Gross per 24 hour  Intake    720 ml  Output      0 ml  Net    720 ml   PHYSICAL EXAM  General: Well developed, well nourished, in no acute distress. Head: Normocephalic, atraumatic, sclera non-icteric, no xanthomas, nares are without discharge.  Neck: Supple without bruits or JVD. Lungs:  Resp regular and unlabored, CTA. Heart: RRR no s3, s4, or murmurs. Abdomen: Soft, non-tender, non-distended, BS + x 4.  Msk:  Strength and tone appears normal for age. Extremities: No clubbing, cyanosis or edema. DP/PT/Radials 2+ and equal bilaterally.  R  wrist cath site w/o bleeding/bruit/hematoma. Neuro: Alert and oriented X 3. Moves all extremities spontaneously. Psych: Normal affect.  LABS: - no labs this am  CBC:  Basename 04/27/11 0525 04/26/11 0502  WBC 13.4* 13.4*  NEUTROABS 9.5* --  HGB 15.0 15.1  HCT 44.6 45.7  MCV 91.0 91.0  PLT 204 202   Basic Metabolic Panel:  Basename 04/27/11 0525 04/26/11 0502 04/25/11 1740  NA 135 135 --  K 3.8 3.8 --  CL 98 97 --  CO2 27 31 --  GLUCOSE 107* 120* --  BUN 14 9 --  CREATININE 0.81 0.64 --  CALCIUM 8.9 9.2 --  MG -- -- 2.1  PHOS -- -- --   Liver Function Tests:  [facility] 04/25/11 1740  AST 242*  ALT 54*  ALKPHOS 91  BILITOT 0.8  PROT 6.8  ALBUMIN 3.7   Cardiac Enzymes:  Basename 04/27/11 2340 04/26/11 0007 04/25/11 1741  CKTOTAL 290* 1459* 1779*  CKMB 7.8* 89.3* 154.4*  CKMBINDEX -- -- --  TROPONINI 7.98* >25.00* >25.00*   Hemoglobin A1C:  Basename 04/25/11 1740  HGBA1C 6.1*   Fasting Lipid Panel:  Basename 04/26/11 0502  CHOL 134  HDL 52  LDLCALC 70  TRIG 60  CHOLHDL 2.6  LDLDIRECT --   Thyroid Function Tests:  Basename 04/25/11 1740  TSH 1.044  T4TOTAL --  T3FREE --  THYROIDAB --     TELE  Sinus brady/wenkebach.  Pr .  Radiology/Studies:  Dg Chest 2 View  04/26/2011  *RADIOLOGY REPORT*  Clinical Data: Myocardial infarction.  Weakness.  CHEST - 2 VIEW  Comparison: 11/05/2007  Findings: The heart size appears normal.  No pleural effusion or pulmonary edema.  Atelectasis is identified within the right lung base.  The left lung is clear.  Review of the visualized osseous structures is significant for mild spondylosis.  IMPRESSION:  1.  Right lung base atelectasis.  Original Report Authenticated By: Rosealee Albee, M.D.      ASSESSMENT AND PLAN:  1.  OO[facility] Inferior MI/CAD:  S/p cath - 100% RCA - med Rx.  Episode of c/p yesterday.  No recurrence.  Cont asa, plavix, acei, statin. Hold BB in setting of new, wide 1st degree avb and  wenchebach.  D/c home today w close outpt f/u.  2.  Wenkebach/brady:  With widening of baseline PR interval.  Will d/c bb and arrange early outpt f/u.  3.  HL:  LDL = 70.  On Simva 20 @ home.  Increase to 40 @ d/c.  4.  HTN:  BP soft.  D/c hctz and bb.  5.  Tobacco Abuse:  Smokes occas cigar.  Cessation advised.  6.  COPD/Asthma/? Bronchitis:  Pt notes productive cough.  Will add Zpack.  Afebrile.  No wheezing today.      Signed, Nicolasa Ducking NP Patient seen and examined and history reviewed. Agree with above findings and plan. Lungs clear on exam. No murmur or rub. Patient's monitor shows periods of 2nd degree AV block (Wenkebach). Given soft BP will hold beta blocker and HCTZ. Encouraged phase II cardiac Rehab. Reports more acid reflux sx even prior to admission. Rec. prilosec 20 mg daily. Will arrange close follow up with Dr. Eden Emms as outpatient.  Thedora Hinders 04/28/2011 8:51 AM   Initial plan for discharge today. Patient got up with cardiac Rehab. HR 70s with ambulation but dropped to 30s when he sat down. Wenkebach AV block. BP also dropped to 80s systolic this am. Will hold DC. Patient asymptomatic. Rehab notes he is pretty unsteady on feet. Will hold all antihypertensive meds. Continue to monitor on telemetry. Reassess tomorrow. Theron Arista [facility] 04/28/11 11:05 amd

## 2011-04-28 NOTE — Progress Notes (Signed)
I met extensively with patient by himself once he was awake. Patient states he does not need the walker, and if he ends up needing one then they will go get one.

## 2011-04-28 NOTE — Progress Notes (Signed)
UR Completed.  [name] [phone] 04/28/2011

## 2011-04-28 NOTE — Progress Notes (Signed)
CARDIAC REHAB PHASE I   PRE:  Rate/Rhythm: 38 Weinbach  BP:  Supine: 8046  Sitting: 84/50  Standing:    SaO2: 94 RA  MODE:  Ambulation: 460 ft   POST:  Rate/Rhythem: 74 SR  BP:  Supine:   Sitting: 88/60  Standing:    SaO2: 94 RA [id number] Completed MI education with pt and wife.While in room RN states that his heart rate was 34. RN notified NP. Ask that we ambulate him. Pt denies and dizziness in bed, sitting or with standing. Assisted to bathroom and then to ambulate. Assisted X 1 and used walker. He leans forward with walking and is DOE. No c/o of any cp or dizziness with walking.Pt agrees to Outpt. CRP in [place], will send referral. Heart rate walking was 74 and as soon as he sat  HR back to 30's.Notified  RN and NP.  Beatrix Fetters

## 2011-04-29 ENCOUNTER — Other Ambulatory Visit: Payer: Self-pay

## 2011-04-29 DIAGNOSIS — R0600 Dyspnea, unspecified: Secondary | ICD-10-CM

## 2011-04-29 DIAGNOSIS — R001 Bradycardia, unspecified: Secondary | ICD-10-CM

## 2011-04-29 DIAGNOSIS — I4891 Unspecified atrial fibrillation: Secondary | ICD-10-CM

## 2011-04-29 LAB — BASIC METABOLIC PANEL
BUN: 23 mg/dL (ref 6–23)
CO2: 26 mEq/L (ref 19–32)
Chloride: 99 mEq/L (ref 96–112)
Creatinine, Ser: 0.89 mg/dL (ref 0.50–1.35)
Potassium: 4 mEq/L (ref 3.5–5.1)

## 2011-04-29 LAB — CBC
HCT: 43.1 % (ref 39.0–52.0)
Hemoglobin: 14.2 g/dL (ref 13.0–17.0)
MCHC: 32.9 g/dL (ref 30.0–36.0)
MCV: 91.7 fL (ref 78.0–100.0)
RDW: 14.2 % (ref 11.5–15.5)
WBC: 10.6 10*3/uL — ABNORMAL HIGH (ref 4.0–10.5)

## 2011-04-29 MED ORDER — ATROPINE SULFATE 1 MG/ML IJ SOLN
INTRAMUSCULAR | Status: AC
  Filled 2011-04-29: qty 1

## 2011-04-29 MED ORDER — WARFARIN SODIUM 7.5 MG PO TABS
7.5000 mg | ORAL_TABLET | Freq: Once | ORAL | Status: AC
  Administered 2011-04-29: 7.5 mg via ORAL
  Filled 2011-04-29: qty 1

## 2011-04-29 MED ORDER — COUMADIN BOOK
Freq: Once | Status: AC
  Administered 2011-04-29: 15:00:00
  Filled 2011-04-29: qty 1

## 2011-04-29 MED ORDER — WARFARIN VIDEO: Freq: Once | Status: DC

## 2011-04-29 NOTE — Progress Notes (Signed)
Note started in error so there..... Cant figuer out how to get rid of it

## 2011-04-29 NOTE — Progress Notes (Signed)
Pt. Having intermittent pauses a-flutter HR down 29-non-sustained B/P 78/56 Pt. Asymptomatic. Pt. Up walking in hallway.HR up to 70-80 B/P 112/68.

## 2011-04-29 NOTE — Progress Notes (Signed)
ANTICOAGULATION CONSULT NOTE - Initial Consult  Pharmacy Consult for Coumadin  Indication: atrial fibrillation  Allergies  Allergen Reactions  . Tiotropium Bromide Monohydrate     REACTION: constipation    Patient Measurements: Height: 6\' 1"  (185.4 cm) Weight: 282 lb 14.4 oz (128.323 kg) IBW/kg (Calculated) : 79.9    Vital Signs: Temp: 98.4 F (36.9 C) (12/15 0623) Temp src: Oral (12/15 0623) BP: 108/72 mmHg (12/15 1328) Pulse Rate: 59  (12/15 0623)  Labs:  Basename 04/29/11 0600 04/27/11 2340 04/27/11 0525  HGB 14.2 -- 15.0  HCT 43.1 -- 44.6  PLT 223 -- 204  APTT -- -- --  LABPROT -- -- --  INR -- -- --  HEPARINUNFRC -- -- --  CREATININE 0.89 -- 0.81  CKTOTAL -- 290* --  CKMB -- 7.8* --  TROPONINI -- 7.98* --   Estimated Creatinine Clearance: 105.4 ml/min (by C-G formula based on Cr of 0.89).  Medical History: Past Medical History  Diagnosis Date  . COPD (chronic obstructive pulmonary disease)   . Coronary artery disease     nstemi 10/2007 - LAD 95p (stented w/ 2.5 x 12mm Promus DES) , D2 99 small, RCA . - enrolled in Tracer Study/// 04/25/2011-OOH MI - Occluded RCA, LAD stent patent.  Medical Rx.  . Gout   . Hyperlipidemia   . Tobacco abuse     Cigarettes x 30 yrs; now occasional cigars.  . Hypertension   . Obesity   . Diverticulosis   . Asthma   . GERD (gastroesophageal reflux disease)   . Hemorrhoids   . Obstructive sleep apnea   . Osteoarthritis   . Mobitz type I Wenckebach atrioventricular block     04/2011 after inferior MI    Medications:  Scheduled:    . aspirin EC  81 mg Oral Daily  . atropine      . azithromycin  250 mg Oral Daily  . budesonide-formoterol  2 puff Inhalation BID  . cholecalciferol  1,000 Units Oral Daily  . clopidogrel  75 mg Oral Q breakfast  . coumadin book   Does not apply Once  . excerise for heart and health book   Does not apply Once  . multivitamins ther. w/minerals  1 tablet Oral Daily  . pantoprazole   40 mg Oral Q1200  . rosuvastatin  40 mg Oral q1800  . sodium chloride  3 mL Intravenous Q12H  . warfarin  7.5 mg Oral ONCE-1800  . warfarin   Does not apply Once   Infusions:    . sodium chloride 100 mL/hr at 04/29/11 0322   Anti-infectives     Start     Dose/Rate Route Frequency Ordered Stop   04/29/11 1000   azithromycin (ZITHROMAX) tablet 250 mg        250 mg Oral Daily 04/28/11 1106 05/03/11 0959   04/28/11 1200   azithromycin (ZITHROMAX) tablet 500 mg        500 mg Oral Daily 04/28/11 1106 04/28/11 1320   04/28/11 0000   azithromycin (ZITHROMAX Z-PAK) 250 MG tablet           04/28/11 5409 05/03/11 2359          Assessment: 70 yo M with afib to start coumadin. Baseline INR 1.10. Cbc ok  Goal of Therapy:  INR 2-3   Plan:  - Coumadin 7.5 mg po x 1 dose tonight at 1800 - f/u daily INR   [name], [name] 04/29/2011,2:44 PM

## 2011-04-29 NOTE — Progress Notes (Signed)
Pt Afib w/ frequent pauses 2-2.5 seconds on monitor.  Pt HR decreased to 30's non-sustained.  BP 86/40 manually.  PA w/ [facility] notified.  EKG obtained.  Pt. with c/o CP on inspiration and worsening Shortness of breath "different from baseline" per patient.  Will continue to monitor patient.  Elmer Merwin, Terrilyn Saver

## 2011-04-29 NOTE — Progress Notes (Addendum)
EP CONSULT NOTE   Patient Name: [name]      SUBJECTIVE:moderatley short of breath sitting and with any exertion This history was reviewed. He is moderately limited to the less than one flight of stairs. He has had no syncope. Apparently the issue of atrial fibrillation has been raised always one occasion. The thromboembolic risk factors are notable for r hypertension age and vascular disease for  CHADs VAS score of 3.   He has long standing bradycardia wih ECG 5/11 sinus at 53 with 1AVB He has had increasing problems of late with peripheral edema. He denies snoring as does his wife; however, he does have significant daytime somnolence. He carries a diagnosis of OSA. He is not treated for this.   Past Medical History  Diagnosis Date  . COPD (chronic obstructive pulmonary disease)   . Coronary artery disease     nstemi 10/2007 - LAD 95p (stented w/ 2.5 x 12mm Promus DES) , D2 99 small, RCA . - enrolled in Tracer Study/// 04/25/2011-[facility] MI - Occluded RCA, LAD stent patent.  Medical Rx.  . Gout   . Hyperlipidemia   . Tobacco abuse     Cigarettes x 30 yrs; now occasional cigars.  . Hypertension   . Obesity   . Diverticulosis   . Asthma   . GERD (gastroesophageal reflux disease)   . Hemorrhoids   . Obstructive sleep apnea   . Osteoarthritis   . Mobitz type I Wenckebach atrioventricular block     04/2011 after inferior MI    PHYSICAL EXAM Filed Vitals:   04/29/11 0623 04/29/11 0830 04/29/11 0900 04/29/11 1328  BP: 126/73 86/40 94/56  108/72  Pulse: 59     Temp: 98.4 F (36.9 C)     TempSrc: Oral     Resp: 20     Height:      Weight: 282 lb 14.4 oz (128.323 kg)     SpO2: 97%      Family History  Problem Relation Age of Onset  . Heart attack Father     died @ 24  . Heart failure Father    General appearance: alert, appears stated age, moderate distress and morbidly obese Neck: JVD - 10 cm above sternal notch Lungs: diminished breath sounds posterior -  bilateral Heart: irregularly irregular rhythm and  very slow without murmur Abdomen: soft, non-tender; bowel sounds normal; no masses,  no organomegaly Extremities: edema 1+ edema Skin: Skin color, texture, turgor normal. No rashes or lesions Neurologic: Alert and oriented X 3, normal strength and tone. Normal symmetric reflexes. Normal coordination and gait  TELEMETRY: Reviewed telemetry pt in sinus bradycardia the subsequent atrial fibrillation with heart rates in the 30-70 range:    Intake/Output Summary (Last 24 hours) at 04/29/11 1411 Last data filed at 04/29/11 0322  Gross per 24 hour  Intake      0 ml  Output    725 ml  Net   -725 ml    LABS: Basic Metabolic Panel:  Lab 04/29/11 0865 04/27/11 0525 04/26/11 0502 04/25/11 1740 04/25/11 1405  NA 135 135 135 137 137  K 4.0 3.8 3.8 4.1 3.9  CL 99 98 97 96 96  CO2 26 27 31 28  --  GLUCOSE 120* 107* 120* 119* 123*  BUN 23 14 9 10 10   CREATININE 0.89 0.81 0.64 0.62 0.90  CALCIUM 8.5 8.9 -- -- --  MG -- -- -- 2.1 --  PHOS -- -- -- -- --   Cardiac  Enzymes:  Basename 04/27/11 2340  CKTOTAL 290*  CKMB 7.8*  CKMBINDEX --  TROPONINI 7.98*   CBC:  Lab 04/29/11 0600 04/27/11 0525 04/26/11 0502 04/25/11 1405  WBC 10.6* 13.4* 13.4* --  NEUTROABS -- 9.5* -- --  HGB 14.2 15.0 15.1 17.0  HCT 43.1 44.6 45.7 50.0  MCV 91.7 91.0 91.0 --  PLT 223 204 202 --     TSH normal   ASSESSMENT AND PLAN: Patient Active Hospital Problem List: Inferior myocardial infarction (04/25/2011)   Assessment: The patient is status post out of hospital MI. It is unlikely that this is the explanation for his sinus bradycardia as well as AV nodal disease manifested by very slow conduction the setting of atrial fibrillation.;  The right ventricle was not felt to be involved and so it is unlikely Explanation for his hypotension  Ejection fraction was 40%.    Plan: We will try to decrease his volume as a congestive heart failure as per the picture.  We'll check a BNP  Atrial fibrillation (04/29/2011)   Assessment: Apparently he has had atrial fibrillation at least one other time as he recalls DrAvva having spoken to him about this. This may further be contributing to his exercise intolerance. He also has a thrombo embolic risk profile that will require long-term anticoagulation. I think we should anticipate trying to get him back in regular rhythm now which would prompt the use of Coumadin. This will require triple drug therapy for at least a month but I would anticipate with his platelet agents could be discontinued after this time. In the event that he does not revert to sinus rhythm, cardioversion would be appropriate. We'll keep an eye on the duration and anticipate adding heparin if necessary though I would like to avoid it with his other anti-platelet therapies. As such cardioversdion by Monday morning would be great Onset was 3 AM today    Plan: Add Coumadin, possible cardioversion tomorrow.  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 With his depressed left ventricular function, wewould probably need to put in a CRT-P. device   Plan: Will follow  Dyspnea (04/29/2011)   Assessment: As above   Plan: Diuresis and measure BNP  Mobitz type I Wenckebach atrioventricular block ()   Assessment: As above   Plan: As above       Signed, Sherryl Manges MD  04/29/2011

## 2011-04-30 LAB — PRO B NATRIURETIC PEPTIDE: Pro B Natriuretic peptide (BNP): 1903 pg/mL — ABNORMAL HIGH (ref 0–125)

## 2011-04-30 MED ORDER — FUROSEMIDE 10 MG/ML IJ SOLN
40.0000 mg | Freq: Two times a day (BID) | INTRAMUSCULAR | Status: AC
  Administered 2011-04-30 (×2): 40 mg via INTRAVENOUS
  Filled 2011-04-30 (×2): qty 4

## 2011-04-30 MED ORDER — CAPTOPRIL 6.25 MG HALF TABLET
6.2500 mg | ORAL_TABLET | Freq: Two times a day (BID) | ORAL | Status: DC
  Administered 2011-04-30 – 2011-05-01 (×3): 6.25 mg via ORAL
  Filled 2011-04-30 (×5): qty 1

## 2011-04-30 NOTE — Progress Notes (Signed)
Patient Name: [name]      SUBJECTIVE:much improved though still puffing  Past Medical History  Diagnosis Date  . COPD (chronic obstructive pulmonary disease)   . Coronary artery disease     nstemi 10/2007 - LAD 95p (stented w/ 2.5 x 12mm Promus DES) , D2 99 small, RCA . - enrolled in Tracer Study/// 04/25/2011-OO[facility] MI - Occluded RCA, LAD stent patent.  Medical Rx.  . Gout   . Hyperlipidemia   . Tobacco abuse     Cigarettes x 30 yrs; now occasional cigars.  . Hypertension   . Obesity   . Diverticulosis   . Asthma   . GERD (gastroesophageal reflux disease)   . Hemorrhoids   . Obstructive sleep apnea   . Osteoarthritis   . Mobitz type I Wenckebach atrioventricular block     04/2011 after inferior MI    PHYSICAL EXAM Filed Vitals:   04/29/11 1948 04/29/11 2100 04/30/11 0500 04/30/11 0739  BP:  100/61 106/74   Pulse:  60 73   Temp:  98.6 F (37 C) 97.9 F (36.6 C)   TempSrc:  Oral Oral   Resp:  18 18   Height:      Weight:   282 lb 3 oz (128 kg)   SpO2: 93% 95% 97% 92%    General appearance: alert, cooperative, mild distress and moderately obese Neck: JVD - 7 cm above sternal notch Lungs: diminished breath sounds posterior - bilateral Heart: regular rate and rhythm, S1, S2 normal, no murmur, click, rub or gallop Abdomen: soft, non-tender; bowel sounds normal; no masses,  no organomegaly Extremities: edema 2+_ Skin: Skin color, texture, turgor normal. No rashes or lesions Neurologic: Alert and oriented X 3, normal strength and tone. Normal symmetric reflexes. Normal coordination and gait   TELEMETRY: Reviewed telemetry pt in has reverted to sinus rhythm and the heart rate is in the 60s:    Intake/Output Summary (Last 24 hours) at 04/30/11 0744 Last data filed at 04/30/11 0100  Gross per 24 hour  Intake     80 ml  Output    750 ml  Net   -670 ml    LABS: Basic Metabolic Panel:  Lab 04/29/11 9604 04/27/11 0525 04/26/11 0502 04/25/11 1740  04/25/11 1405  NA 135 135 135 137 137  K 4.0 3.8 3.8 4.1 3.9  CL 99 98 97 96 96  CO2 26 27 31 28  --  GLUCOSE 120* 107* 120* 119* 123*  BUN 23 14 9 10 10   CREATININE 0.89 0.81 0.64 0.62 0.90  CALCIUM 8.5 8.9 -- -- --  MG -- -- -- 2.1 --  PHOS -- -- -- -- --   Cardiac Enzymes:  Basename 04/27/11 2340  CKTOTAL 290*  CKMB 7.8*  CKMBINDEX --  TROPONINI 7.98*   CBC:  Lab 04/29/11 0600 04/27/11 0525 04/26/11 0502 04/25/11 1405  WBC 10.6* 13.4* 13.4* --  NEUTROABS -- 9.5* -- --  HGB 14.2 15.0 15.1 17.0  HCT 43.1 44.6 45.7 50.0  MCV 91.7 91.0 91.0 --  PLT 223 204 202 --   PROTIME:  Basename 04/29/11 1606  LABPROT 15.6*  INR 1.21        ASSESSMENT AND PLAN:  Patient Active Hospital Problem List: Inferior myocardial infarction (04/25/2011)   Assessment: stable post MI BP improving barely; will continue ASA/PLAVIX x 1 month ; EF 40-45%; with evidence of volume overload, we willuse a little diuretic today and begin a touch o f ACE  Plan: home 24-48 hrs; post mi meds are limited by both bradycardia and low BP;  He has evid Atrial fibrillation (04/29/2011)   Assessment: has spontaneously reverted   Plan: i am inclined to hold coumdin while he is on DPT 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

## 2011-05-01 ENCOUNTER — Other Ambulatory Visit: Payer: Self-pay

## 2011-05-01 ENCOUNTER — Telehealth: Payer: Self-pay | Admitting: Cardiovascular Disease

## 2011-05-01 ENCOUNTER — Encounter (HOSPITAL_COMMUNITY): Payer: Self-pay | Admitting: Nurse Practitioner

## 2011-05-01 ENCOUNTER — Encounter: Payer: Medicare Other | Admitting: Physician Assistant

## 2011-05-01 DIAGNOSIS — I219 Acute myocardial infarction, unspecified: Secondary | ICD-10-CM

## 2011-05-01 LAB — BASIC METABOLIC PANEL
BUN: 17 mg/dL (ref 6–23)
Calcium: 9.1 mg/dL (ref 8.4–10.5)
Creatinine, Ser: 0.9 mg/dL (ref 0.50–1.35)
GFR calc non Af Amer: 83 mL/min — ABNORMAL LOW (ref >90)
Glucose, Bld: 107 mg/dL — ABNORMAL HIGH (ref 70–99)
Sodium: 138 mEq/L (ref 135–145)

## 2011-05-01 MED ORDER — LISINOPRIL 10 MG PO TABS: 10.0000 mg | ORAL_TABLET | Freq: Every day | ORAL | Status: DC

## 2011-05-01 MED ORDER — NITROGLYCERIN 0.4 MG SL SUBL: 0.4000 mg | SUBLINGUAL_TABLET | SUBLINGUAL | Status: DC | PRN

## 2011-05-01 MED ORDER — POTASSIUM CHLORIDE ER 10 MEQ PO TBCR: 10.0000 meq | EXTENDED_RELEASE_TABLET | Freq: Every day | ORAL | Status: DC

## 2011-05-01 MED ORDER — FUROSEMIDE 20 MG PO TABS: 20.0000 mg | ORAL_TABLET | Freq: Every day | ORAL | Status: DC

## 2011-05-01 MED ORDER — CLOPIDOGREL BISULFATE 75 MG PO TABS: 75.0000 mg | ORAL_TABLET | Freq: Every day | ORAL | Status: DC

## 2011-05-01 NOTE — Telephone Encounter (Signed)
Pt was just released today and he did not get all his Rx he still needs Plavix 75mg  qd and Nitro called into [facility] on [address]

## 2011-05-01 NOTE — Progress Notes (Signed)
Pt discharged to home via wheelchair with spouse. Discussed daily wts, when to call MD and the need to stay on low sodium diet. Dishcarge instructions given and pt. Verbalized understanding. No further questions at this time.

## 2011-05-01 NOTE — Discharge Summary (Signed)
See progress note from today 11:57 AM [name] 05/01/2011

## 2011-05-01 NOTE — Progress Notes (Signed)
Patient ID: [name], male   DOB: [birth year], 70 y.o.   MRN: [id number] @ Subjective:  Denies SSCP, palpitations or Dyspnea   Objective:  Filed Vitals:   04/30/11 0739 04/30/11 1550 04/30/11 2128 05/01/11 0500  BP:  107/64 105/69 98/56  Pulse:  77 78 58  Temp:  98.7 F (37.1 C) 99.1 F (37.3 C) 98 F (36.7 C)  TempSrc:  Oral Oral Oral  Resp:  18 18 18   Height:      Weight:    129.3 kg (285 lb 0.9 oz)  SpO2: 92% 96% 96%     Intake/Output from previous day:  Intake/Output Summary (Last 24 hours) at 05/01/11 1003 Last data filed at 05/01/11 0500  Gross per 24 hour  Intake     95 ml  Output   4400 ml  Net  -4305 ml    Physical Exam: General appearance: alert and no distress Lungs: clear to auscultation bilaterally Heart: Wenkeb ach with no rub murmur or gallop   Abdomen: soft, non-tender; bowel sounds normal; no masses,  no organomegaly Extremities: Trace edema bilaterally Pulses: 2+ and symmetric  Lab Results: Basic Metabolic Panel:  Basename 05/01/11 0600 04/29/11 0600  NA 138 135  K 3.4* 4.0  CL 98 99  CO2 30 26  GLUCOSE 107* 120*  BUN 17 23  CREATININE 0.90 0.89  CALCIUM 9.1 8.5  MG -- --  PHOS -- --    Basename 04/29/11 0600  WBC 10.6*  NEUTROABS --  HGB 14.2  HCT 43.1  MCV 91.7  PLT 223     Telemetry:  PAF this am around 8:30  Currently wenkebach at rate of 90  Echo: 12.12  EF 40-45%  Medications:     . aspirin EC  81 mg Oral Daily  . azithromycin  250 mg Oral Daily  . budesonide-formoterol  2 puff Inhalation BID  . captopril  6.25 mg Oral BID  . cholecalciferol  1,000 Units Oral Daily  . clopidogrel  75 mg Oral Q breakfast  . excerise for heart and health book   Does not apply Once  . furosemide  40 mg Intravenous BID  . multivitamins ther. w/minerals  1 tablet Oral Daily  . pantoprazole  40 mg Oral Q1200  . rosuvastatin  40 mg Oral q1800  . sodium chloride  3 mL Intravenous Q12H       . sodium chloride 100 mL/hr at  04/29/11 4098    Assessment/Plan:  IMI:  Late presentation with occluded RCA Rx medically PAF:  In NSR  Prefer not to place on coumadin with ASA and Plavix and recent mi Wenkebach:  Needs 21 day event monitor avoid beta blockers CHF:  D/C with lasix 20mg  and KCL 10 mEq in addition to ace  F/U with me in 3-4 weeks  Charlton Haws 05/01/2011, 10:03 AM

## 2011-05-01 NOTE — Telephone Encounter (Signed)
Rx ordered and pt was notified.

## 2011-05-01 NOTE — Progress Notes (Signed)
Patient Name: [name] Date of Encounter: 05/01/2011     Principal Problem:  *Inferior myocardial infarction Active Problems:  Mobitz type I Wenckebach atrioventricular block  HYPERLIPIDEMIA-MIXED  HYPERTENSION, BORDERLINE  Overweight  Acute bronchitis  Coronary artery disease  Atrial fibrillation  Sinus bradycardia  Dyspnea    SUBJECTIVE:  No c/p, sob.  Eager to go home.  Cont to have intermittent atrial arrhythmias.   CURRENT MEDS    . aspirin EC  81 mg Oral Daily  . azithromycin  250 mg Oral Daily  . budesonide-formoterol  2 puff Inhalation BID  . captopril  6.25 mg Oral BID  . cholecalciferol  1,000 Units Oral Daily  . clopidogrel  75 mg Oral Q breakfast  . excerise for heart and health book   Does not apply Once  . furosemide  40 mg Intravenous BID  . multivitamins ther. w/minerals  1 tablet Oral Daily  . pantoprazole  40 mg Oral Q1200  . rosuvastatin  40 mg Oral q1800  . sodium chloride  3 mL Intravenous Q12H    OBJECTIVE  Filed Vitals:   04/30/11 0739 04/30/11 1550 04/30/11 2128 05/01/11 0500  BP:  107/64 105/69 98/56  Pulse:  77 78 58  Temp:  98.7 F (37.1 C) 99.1 F (37.3 C) 98 F (36.7 C)  TempSrc:  Oral Oral Oral  Resp:  18 18 18   Height:      Weight:    285 lb 0.9 oz (129.3 kg)  SpO2: 92% 96% 96%     Intake/Output Summary (Last 24 hours) at 05/01/11 0815 Last data filed at 05/01/11 0500  Gross per 24 hour  Intake    119 ml  Output   5700 ml  Net  -5581 ml   Weight change: 2 lb 13.9 oz (1.3 kg)  PHYSICAL EXAM  General: Well developed, well nourished, in no acute distress. Head: Normocephalic, atraumatic, sclera non-icteric, no xanthomas, nares are without discharge.  Neck: Supple without bruits or JVD. Lungs:  Resp regular and unlabored, CTA. Heart: RRR no s3, s4, or murmurs. Abdomen: Soft, non-tender, non-distended, BS + x 4.  Msk:  Strength and tone appears normal for age. Extremities: No clubbing, cyanosis or  edema. DP/PT/Radials 2+ and equal bilaterally. Neuro: Alert and oriented X 3. Moves all extremities spontaneously. Psych: Normal affect.  LABS:  CBC:  Basename 04/29/11 0600  WBC 10.6*  NEUTROABS --  HGB 14.2  HCT 43.1  MCV 91.7  PLT 223   Basic Metabolic Panel:  Basename 05/01/11 0600 04/29/11 0600  NA 138 135  K 3.4* 4.0  CL 98 99  CO2 30 26  GLUCOSE 107* 120*  BUN 17 23  CREATININE 0.90 0.89  CALCIUM 9.1 8.5  MG -- --  PHOS -- --   TELE:  RSR, 1st degree AVB - now a.flutter with variable conduction - rates 60's.   ASSESSMENT AND PLAN:  1.  Subacute out of hospital Inferior MI/CAD:  Medically managed.  No chest pain.  No Beta Blocker secondary to intermittent bradycardia with rates down into the twenties over the weekend.  Cont asa.  In light of atrial arrhythmias consider coumadin over plavix, esp since NO stent was placed or PCI performed.  Will d/w Dr. Eden Emms.  Cont Statin (pt on prava @ home - LDL 70).  2.  Afib/flutter:  Rate controlled.  Consider coumadin as above.  Prev plan for AF detection monitor x 30 days.  3.  Sinus Bradycardia/Wenckebach:  No  beta blocker.  Appropriate chronotropic response.  No indication for pacing.  4.  Acute Bronchitis:  Last day of Zpack today.  5.  HTN:  bp has been soft throughout admission.  6.  Acute Systolic CHF:  Volume looks good.  Cont low dose acei.  No bb 2/2 #3.  Prob will need home lasix.  7.  HL:  Cont Statin.       Signed, Nicolasa Ducking NP

## 2011-05-01 NOTE — Progress Notes (Signed)
CARDIAC REHAB PHASE I   PRE:  Rate/Rhythm: 91    BP: sitting 100/68    SaO2: 95 RA  MODE:  Ambulation: 420 ft   POST:  Rate/Rhythm: 93    BP: sitting 104/70     SaO2: 95 RA  Pt tolerated fine. HR remained 83-93. No c/o. Encouraged pt to use RW for open spaces at home. Reluctant. Does not seem to have good understanding of his medical needs. Gets feet outside of RW at times. Instructed on proper use. Needs RW for home. [id number]  Harriet Masson CES, ACSM

## 2011-05-05 ENCOUNTER — Encounter (INDEPENDENT_AMBULATORY_CARE_PROVIDER_SITE_OTHER): Payer: Medicare Other

## 2011-05-05 DIAGNOSIS — I4891 Unspecified atrial fibrillation: Secondary | ICD-10-CM

## 2011-05-18 ENCOUNTER — Encounter: Payer: Self-pay | Admitting: Physician Assistant

## 2011-05-19 ENCOUNTER — Encounter: Payer: Self-pay | Admitting: Physician Assistant

## 2011-05-19 ENCOUNTER — Ambulatory Visit (INDEPENDENT_AMBULATORY_CARE_PROVIDER_SITE_OTHER): Payer: Medicare Other | Admitting: Physician Assistant

## 2011-05-19 DIAGNOSIS — I2589 Other forms of chronic ischemic heart disease: Secondary | ICD-10-CM

## 2011-05-19 DIAGNOSIS — R001 Bradycardia, unspecified: Secondary | ICD-10-CM

## 2011-05-19 DIAGNOSIS — I2510 Atherosclerotic heart disease of native coronary artery without angina pectoris: Secondary | ICD-10-CM

## 2011-05-19 DIAGNOSIS — I10 Essential (primary) hypertension: Secondary | ICD-10-CM

## 2011-05-19 DIAGNOSIS — I4892 Unspecified atrial flutter: Secondary | ICD-10-CM

## 2011-05-19 DIAGNOSIS — I5022 Chronic systolic (congestive) heart failure: Secondary | ICD-10-CM | POA: Insufficient documentation

## 2011-05-19 DIAGNOSIS — I4891 Unspecified atrial fibrillation: Secondary | ICD-10-CM

## 2011-05-19 DIAGNOSIS — I498 Other specified cardiac arrhythmias: Secondary | ICD-10-CM

## 2011-05-19 DIAGNOSIS — I255 Ischemic cardiomyopathy: Secondary | ICD-10-CM

## 2011-05-19 DIAGNOSIS — E785 Hyperlipidemia, unspecified: Secondary | ICD-10-CM

## 2011-05-19 LAB — PROTIME-INR: Prothrombin Time: 11.5 s (ref 10.2–12.4)

## 2011-05-19 LAB — BASIC METABOLIC PANEL
BUN: 17 mg/dL (ref 6–23)
Calcium: 9.4 mg/dL (ref 8.4–10.5)
Creatinine, Ser: 0.9 mg/dL (ref 0.4–1.5)
GFR: 83.82 mL/min (ref >60.00)
Potassium: 4.4 mEq/L (ref 3.5–5.1)

## 2011-05-19 MED ORDER — WARFARIN SODIUM 5 MG PO TABS: ORAL_TABLET | ORAL | Status: DC

## 2011-05-19 NOTE — Assessment & Plan Note (Signed)
He is having paroxysmal atrial fibrillation/flutter.  CHADS2-VASc score is 4.  I reviewed his case with Dr. Graciela Husbands.  We will keep him on dual antiplatelet therapy for a total of 30 days post MI.  We will then stop his aspirin and start Coumadin.  He will remain on Coumadin and Plavix.  He denies any history of bleeding disorders.  The patient prefers to have his Coumadin followup with his PCP.  He will have his Coumadin checked one week after initiation.  Check a INR today.  Of note, the patient was convinced that he was already on Coumadin.  There is no record of this prescription at his pharmacy.  He will continue his monitor.  Followup with Dr. Eden Emms in 4 weeks.

## 2011-05-19 NOTE — Progress Notes (Signed)
[address] [place], [place]  [address] Phone: [phone] Fax:  [phone]  Date:  05/19/2011   Name:  [name]       DOB:  [birth year] MRN:  [id number]  PCP:  Dr. Felipa Eth Primary Cardiologist:  Dr. Charlton Haws  Primary Electrophysiologist:  Dr. Sherryl Manges    History of Present Illness: [name] is a 71 y.o. male who presents for post hospital follow up.  He has a history of CAD, status post DES to the LAD in 2008, hypertension, hyperlipidemia, COPD.  He was admitted 12/11-12/17 with an out of hospital inferior NSTEMI.  He presented with greater than 24 hours of chest pain and inferior STE.  Urgent LHC 04/25/11: LM 10%, LAD stent patent, mid LAD 40%, CFX scattered 20%, proximal RCA occluded, inferior HK, EF 40-45%.  It was felt that he had completed his inferior MI and was managed medically.  2-D echo 04/26/11: EF 40-45%, inferoseptal akinesis and inferior hypokinesis, mild LAE.  The patient became bradycardic on beta blocker therapy and developed a long first degree AV block (310 ms) and Mobitz one heart block.  His beta blocker was discontinued.  He did have heart rates dipping into the high 20s at times without symptoms.  He also developed paroxysmal atrial fibrillation/flutter with rates in the 30s.  He was seen by Dr. Sherryl Manges for EP.  He was felt to require cardioversion.  However the patient reverted back to sinus rhythm on his own.  Coumadin was not initiated due to the need for dual antiplatelet therapy.  He developed acute systolic heart failure and was diuresed.  He was discharged home with plans for an outpatient event monitor.  Labs: Hemoglobin 14.2, potassium 3.4, creatinine 0.9, AST 242, ALT 54, TSH 1.044, TC 134, TG 60, HDL 52, LDL 70, BNP 1903.  Chest x-ray demonstrated right lung atelectasis.  I have just reviewed the available strips from his event monitor which he is currently wearing.  He has had several episodes of atrial  flutter with heart rates as low as 39 up to about 115.  No significant pauses noted.  He denies chest pain.  He does have some dyspnea with exertion.  He describes NYHA class II-IIb symptoms.  No orthopnea, PND or edema.  No syncope or near-syncope.  He denies palpitations.  Past Medical History  Diagnosis Date  . COPD (chronic obstructive pulmonary disease)   . Coronary artery disease     nstemi 10/2007 - LAD 95p (stented w/ 2.5 x 12mm Promus DES) , D2 99 small, RCA . - enrolled in Tracer Study/// 04/25/2011-OOH MI - Occluded RCA, LAD stent patent.  Medical Rx.  . Gout   . Hyperlipidemia   . Tobacco abuse     Cigarettes x 30 yrs; now occasional cigars.  . Hypertension   . Obesity   . Diverticulosis   . Asthma   . GERD (gastroesophageal reflux disease)   . Hemorrhoids   . Obstructive sleep apnea   . Osteoarthritis   . Mobitz type I Wenckebach atrioventricular block     04/2011 after inferior MI  . Ischemic cardiomyopathy     2-D echo 04/26/11: EF 40-45%, inferoseptal akinesis and inferior hypokinesis, mild LAE  . Chronic systolic heart failure   . Atrial flutter     coumadin initiated 05/30/11 - to be followed by PCP    Current Outpatient Prescriptions  Medication Sig Dispense Refill  . albuterol (PROVENTIL HFA;VENTOLIN HFA) 108 (  90 BASE) MCG/ACT inhaler Inhale 1 puff into the lungs every 6 (six) hours as needed. For shortness of breath.       Marland Kitchen aspirin 81 MG chewable tablet Chew 81 mg by mouth daily.        . budesonide-formoterol (SYMBICORT) 160-4.5 MCG/ACT inhaler Inhale 2 puffs into the lungs 2 (two) times daily.        . Cholecalciferol (VITAMIN D) 2000 UNITS CAPS Take 1 capsule by mouth daily.        . clopidogrel (PLAVIX) 75 MG tablet Take 1 tablet (75 mg total) by mouth daily with breakfast.  30 tablet  6  . Coenzyme Q10 (CO Q-10) 75 MG CAPS Take 1 capsule by mouth daily.        . furosemide (LASIX) 20 MG tablet Take 1 tablet (20 mg total) by mouth daily.  30 tablet  6    . lisinopril (PRINIVIL,ZESTRIL) 10 MG tablet Take 1 tablet (10 mg total) by mouth daily.  30 tablet  6  . Multiple Vitamins-Minerals (MULTIVITAMINS THER. W/MINERALS) TABS Take 1 tablet by mouth daily.        . nitroGLYCERIN (NITROSTAT) 0.4 MG SL tablet Place 1 tablet (0.4 mg total) under the tongue every 5 (five) minutes as needed for chest pain.  25 tablet  3  . omeprazole (PRILOSEC OTC) 20 MG tablet Take 1 tablet (20 mg total) by mouth daily.      . potassium chloride (K-DUR) 10 MEQ tablet Take 1 tablet (10 mEq total) by mouth daily.  30 tablet  6  . pravastatin (PRAVACHOL) 40 MG tablet Take 1 tablet (40 mg total) by mouth daily.  30 tablet  6    Allergies: Allergies  Allergen Reactions  . Tiotropium Bromide Monohydrate     REACTION: constipation    History  Substance Use Topics  . Smoking status: Former Smoker -- 30 years    Types: Cigars  . Smokeless tobacco: Not on file   Comment: pt has 30 pack yr history of cigarette use; now smoking occasional cigar.  . Alcohol Use: 0.5 oz/week    1 drink(s) per week     ROS:  Please see the history of present illness.    All other systems reviewed and negative.   PHYSICAL EXAM: VS:  BP 128/76  Pulse 75  Ht 6\' 1"  (1.854 m)  Wt 278 lb 6.4 oz (126.281 kg)  BMI 36.73 kg/m2 Well nourished, well developed, in no acute distress HEENT: normal Neck: no JVD Cardiac:  normal S1, S2; RRR; no murmur Lungs:  clear to auscultation bilaterally, no wheezing, rhonchi or rales Abd: soft, nontender, no hepatomegaly Ext: no edema; Right radial site without hematoma or bruit Skin: warm and dry Neuro:  CNs 2-12 intact, no focal abnormalities noted  EKG:   Sinus rhythm, heart rate 75, first degree AV block with a PR interval of 214 ms, inferior Q waves, inferior T-wave inversions, poor R-wave progression, T wave inversion V4-V6.  ASSESSMENT AND PLAN:

## 2011-05-19 NOTE — Assessment & Plan Note (Signed)
He is not symptomatic with bradycardia.  I reviewed this with Dr. Graciela Husbands.  He will complete his monitor.  At this point, there is no indication for pacemaker implantation.  There is no indication for rhythm control.

## 2011-05-19 NOTE — Assessment & Plan Note (Signed)
Check lipids and LFTs in 6 weeks.

## 2011-05-19 NOTE — Assessment & Plan Note (Signed)
He will likely need reassessment of his LV function with an echocardiogram in the next 6-12 months.

## 2011-05-19 NOTE — Patient Instructions (Signed)
Your physician has recommended you make the following change in your medication: START Coumadin 5mg  daily, except on Saturday and Sunday take 2.5mg  (one-half tablet)--start this on 05/27/11, STOP Aspirin when you start coumadin, Stay on Plavix  Your physician recommends that you have lab work today: BMP and INR  Your physician recommends that you return for a FASTING lipid and liver profile in 6 WEEKS, nothing to eat or drink after midnight, lab opens at 8:30   Your physician recommends that you schedule a follow-up appointment in: 4 WEEKS with Dr Eden Emms  Please schedule the patient for a 1 WEEK New patient Coumadin appointment with Dr Felipa Eth.  The patient will be starting coumadin on 05/27/11 and should have his INR checked on 06/02/11.

## 2011-05-19 NOTE — Assessment & Plan Note (Signed)
Controlled.  

## 2011-05-19 NOTE — Assessment & Plan Note (Signed)
Volume stable.  Check a basic metabolic panel today.  Continue ACE inhibitor.  No beta blocker due to bradycardia.  Followup with Dr. Eden Emms in 4 weeks.

## 2011-05-19 NOTE — Assessment & Plan Note (Signed)
Overall, he is doing well post MI.  He is not interested in formal cardiac rehabilitation.  He will increase activity on his home.  See discussion below regarding dual antiplatelet therapy.

## 2011-05-22 ENCOUNTER — Telehealth: Payer: Self-pay | Admitting: Cardiovascular Disease

## 2011-05-22 NOTE — Telephone Encounter (Signed)
FU Call: Pt returning call from [place] from Friday. Please return pt call to discuss further.

## 2011-05-22 NOTE — Telephone Encounter (Signed)
PT AWARE OF LAB RESULTS./CY 

## 2011-05-30 ENCOUNTER — Telehealth: Payer: Self-pay | Admitting: *Deleted

## 2011-05-30 NOTE — Telephone Encounter (Signed)
[name]  TO HAVE PT RETURN CALL RE MONITOR RESULTS PAF PER DR Eden Emms .[name]

## 2011-05-30 NOTE — Telephone Encounter (Signed)
F/U  Patient returning nurse CY call.  Patient can be reached at home#

## 2011-05-30 NOTE — Telephone Encounter (Signed)
PT  AWARE OF  MONITOR RESULTS ./CY 

## 2011-06-12 ENCOUNTER — Ambulatory Visit (INDEPENDENT_AMBULATORY_CARE_PROVIDER_SITE_OTHER): Payer: Medicare Other | Admitting: Cardiovascular Disease

## 2011-06-12 ENCOUNTER — Encounter: Payer: Self-pay | Admitting: Cardiovascular Disease

## 2011-06-12 DIAGNOSIS — I4891 Unspecified atrial fibrillation: Secondary | ICD-10-CM

## 2011-06-12 DIAGNOSIS — I2510 Atherosclerotic heart disease of native coronary artery without angina pectoris: Secondary | ICD-10-CM

## 2011-06-12 DIAGNOSIS — E785 Hyperlipidemia, unspecified: Secondary | ICD-10-CM

## 2011-06-12 DIAGNOSIS — I10 Essential (primary) hypertension: Secondary | ICD-10-CM

## 2011-06-12 NOTE — Assessment & Plan Note (Signed)
Cholesterol is at goal.  Continue current dose of statin and diet Rx.  No myalgias or side effects.  F/U  LFT's in 6 months. Lab Results  Component Value Date   LDLCALC 70 04/26/2011

## 2011-06-12 NOTE — Progress Notes (Signed)
Patient ID: [name], male   DOB: [birth year], 71 y.o.   MRN: [id number] [name] is a 71 y.o. male who presents for post hospital follow up. He has a history of CAD, status post DES to the LAD in 2008, hypertension, hyperlipidemia, COPD. He was admitted 12/11-12/17 with an out of hospital inferior NSTEMI. He presented with greater than 24 hours of chest pain and inferior STE. Urgent LHC 04/25/11: LM 10%, LAD stent patent, mid LAD 40%, CFX scattered 20%, proximal RCA occluded, inferior HK, EF 40-45%. It was felt that he had completed his inferior MI and was managed medically. 2-D echo 04/26/11: EF 40-45%, inferoseptal akinesis and inferior hypokinesis, mild LAE. The patient became bradycardic on beta blocker therapy and developed a long first degree AV block (310 ms) and Mobitz one heart block. His beta blocker was discontinued. He did have heart rates dipping into the high 20s at times without symptoms. He also developed paroxysmal atrial fibrillation/flutter with rates in the 30s. He was seen by Dr. Sherryl Manges for EP. He was felt to require cardioversion. However the patient reverted back to sinus rhythm on his own. Coumadin was not initiated due to the need for dual antiplatelet therapy. He developed acute systolic heart failure and was diuresed. He was discharged home with plans for an outpatient event monitor. Labs: Hemoglobin 14.2, potassium 3.4, creatinine 0.9, AST 242, ALT 54, TSH 1.044, TC 134, TG 60, HDL 52, LDL 70, BNP 1903. Chest x-ray demonstrated right lung atelectasis.   Event monitor shows continued SSS/PAF.  After 30 days from MI plavix stopped and patient has been on coumadin and ASA 81mg .    ROS: Denies fever, malais, weight loss, blurry vision, decreased visual acuity, cough, sputum, SOB, hemoptysis, pleuritic pain, palpitaitons, heartburn, abdominal pain, melena, lower extremity edema, claudication, or rash.  All other systems reviewed and negative  General: Affect  appropriate Healthy:  appears stated age HEENT: normal Neck supple with no adenopathy JVP normal no bruits no thyromegaly Lungs clear with no wheezing and good diaphragmatic motion Heart:  S1/S2 no murmur, no rub, gallop or click PMI normal Abdomen: benighn, BS positve, no tenderness, no AAA no bruit.  No HSM or HJR Distal pulses intact with no bruits No edema Neuro non-focal Skin warm and dry No muscular weakness   Current Outpatient Prescriptions  Medication Sig Dispense Refill  . albuterol (PROVENTIL HFA;VENTOLIN HFA) 108 (90 BASE) MCG/ACT inhaler Inhale 1 puff into the lungs every 6 (six) hours as needed. For shortness of breath.       Marland Kitchen amoxicillin (AMOXIL) 875 MG tablet Take 875 mg by mouth 2 (two) times daily.       . budesonide-formoterol (SYMBICORT) 160-4.5 MCG/ACT inhaler Inhale 2 puffs into the lungs 2 (two) times daily.        . Cholecalciferol (VITAMIN D) 2000 UNITS CAPS Take 1 capsule by mouth daily.        . clopidogrel (PLAVIX) 75 MG tablet Take 1 tablet (75 mg total) by mouth daily with breakfast.  30 tablet  6  . Coenzyme Q10 (CO Q-10) 75 MG CAPS Take 1 capsule by mouth daily.        . furosemide (LASIX) 20 MG tablet Take 1 tablet (20 mg total) by mouth daily.  30 tablet  6  . lisinopril (PRINIVIL,ZESTRIL) 10 MG tablet Take 1 tablet (10 mg total) by mouth daily.  30 tablet  6  . Multiple Vitamins-Minerals (MULTIVITAMINS THER. W/MINERALS) TABS Take 1 tablet  by mouth daily.        . nitroGLYCERIN (NITROSTAT) 0.4 MG SL tablet Place 1 tablet (0.4 mg total) under the tongue every 5 (five) minutes as needed for chest pain.  25 tablet  3  . potassium chloride (K-DUR) 10 MEQ tablet Take 1 tablet (10 mEq total) by mouth daily.  30 tablet  6  . pravastatin (PRAVACHOL) 40 MG tablet Take 1 tablet (40 mg total) by mouth daily.  30 tablet  6  . warfarin (COUMADIN) 5 MG tablet Take as directed  30 tablet  0    Allergies  Tiotropium bromide monohydrate  Electrocardiogram: NSR  baseline artifact  Recent IMI  Old anterior MI  QT 452  Assessment and Plan

## 2011-06-12 NOTE — Patient Instructions (Addendum)
Your physician wants you to follow-up in:  AS SOON AS POSSIBLE  WITH  DR Graciela Husbands TO DECIDE IF WOULD BENEFIT WITH  AMIOADARONE OR DEVICE   You will receive a reminder letter in the mail two months in advance. If you don't receive a letter, please call our office to schedule the follow-up appointment.  Your physician has recommended you make the following change in your medication: STOP PLAVIX  IF TAKING RESTART ASPIRIN 81 MG  1 EVERY DAY

## 2011-06-12 NOTE — Assessment & Plan Note (Signed)
Recent IMI late presentation Rx mediaclly.  Patent stent to LAD.  Continue ASA and meds

## 2011-06-12 NOTE — Assessment & Plan Note (Signed)
Well controlled.  Continue current medications and low sodium Dash type diet.    

## 2011-06-12 NOTE — Assessment & Plan Note (Signed)
Event monitor shows frequent PAF.  Patient not always aware.  On coumadin.  Plavix D/C  F/U SK to discuss Amiodarone.  SSS no clear indication for pacer but aggressive Rx for PAF may entail this

## 2011-06-20 ENCOUNTER — Encounter: Payer: Self-pay | Admitting: Cardiovascular Disease

## 2011-06-20 NOTE — Progress Notes (Signed)
Addended by: Judithe Modest D on: 06/20/2011 06:00 PM   Modules accepted: Orders

## 2011-06-23 ENCOUNTER — Telehealth: Payer: Self-pay | Admitting: *Deleted

## 2011-06-23 NOTE — Telephone Encounter (Signed)
LEFT MESSAGE ON ANSWERING MACHINE  APPT CHANGED TO 06-28-11 AT 2:30  MOVED UP FROM  07-06-11  NEEDED EARLIER APPT PER DR NISHAN./CY

## 2011-06-26 ENCOUNTER — Other Ambulatory Visit: Payer: Self-pay | Admitting: Physician Assistant

## 2011-06-26 ENCOUNTER — Other Ambulatory Visit (INDEPENDENT_AMBULATORY_CARE_PROVIDER_SITE_OTHER): Payer: Medicare Other | Admitting: *Deleted

## 2011-06-26 DIAGNOSIS — I4892 Unspecified atrial flutter: Secondary | ICD-10-CM

## 2011-06-26 DIAGNOSIS — I2589 Other forms of chronic ischemic heart disease: Secondary | ICD-10-CM

## 2011-06-26 DIAGNOSIS — E785 Hyperlipidemia, unspecified: Secondary | ICD-10-CM

## 2011-06-26 DIAGNOSIS — I2510 Atherosclerotic heart disease of native coronary artery without angina pectoris: Secondary | ICD-10-CM

## 2011-06-26 LAB — LIPID PANEL
Cholesterol: 143 mg/dL (ref 0–200)
HDL: 55.1 mg/dL (ref >39.00)
Triglycerides: 117 mg/dL (ref 0.0–149.0)

## 2011-06-26 LAB — HEPATIC FUNCTION PANEL
Albumin: 3.8 g/dL (ref 3.5–5.2)
Total Protein: 6.8 g/dL (ref 6.0–8.3)

## 2011-06-28 ENCOUNTER — Ambulatory Visit (INDEPENDENT_AMBULATORY_CARE_PROVIDER_SITE_OTHER): Payer: Medicare Other | Admitting: Internal Medicine

## 2011-06-28 ENCOUNTER — Encounter: Payer: Self-pay | Admitting: Internal Medicine

## 2011-06-28 DIAGNOSIS — I2510 Atherosclerotic heart disease of native coronary artery without angina pectoris: Secondary | ICD-10-CM

## 2011-06-28 DIAGNOSIS — I4892 Unspecified atrial flutter: Secondary | ICD-10-CM

## 2011-06-28 DIAGNOSIS — E785 Hyperlipidemia, unspecified: Secondary | ICD-10-CM

## 2011-06-28 DIAGNOSIS — I2589 Other forms of chronic ischemic heart disease: Secondary | ICD-10-CM

## 2011-06-28 DIAGNOSIS — I4891 Unspecified atrial fibrillation: Secondary | ICD-10-CM

## 2011-06-28 MED ORDER — LISINOPRIL 5 MG PO TABS: 5.0000 mg | ORAL_TABLET | Freq: Every day | ORAL | Status: DC

## 2011-06-28 MED ORDER — WARFARIN SODIUM 5 MG PO TABS: ORAL_TABLET | ORAL | Status: AC

## 2011-06-28 NOTE — Progress Notes (Signed)
HPI  [name] is a 71 y.o. male Seen in followup following recent hospitalization for a nonSTEMI  related to a late presentation of an out-of-hospital MI  it was elected to treat his totally occluded RCA medically. During his hospitalization he had atrial fibrillation that reverted spontaneously to sinus bradycardia. Coumadin was initially withheld as he was on double platelet therapy. Since that time aspirin has been continued along with Coumadin and Plavix has been stopped. He wore an event recorder r during which he had atrial fibrillation for the first half with a peak heart rate of 200 although the mean heart rates were in the 70s and 80s  he had no significant bradycardia. He had no symptoms attributable to his atrial fibrillation. He does have fatigue. When he exercises his heart rate gets up over 90-100.Marland Kitchen  Past Medical History  Diagnosis Date  . COPD (chronic obstructive pulmonary disease)   . Coronary artery disease     nstemi 10/2007 - LAD 95p (stented w/ 2.5 x 12mm Promus DES) , D2 99 small, RCA . - enrolled in Tracer Study/// 04/25/2011-[facility] MI - Occluded RCA, LAD stent patent.  Medical Rx.  . Gout   . Hyperlipidemia   . Tobacco abuse     Cigarettes x 30 yrs; now occasional cigars.  . Hypertension   . Obesity   . Diverticulosis   . Asthma   . GERD (gastroesophageal reflux disease)   . Hemorrhoids   . Obstructive sleep apnea   . Osteoarthritis   . Mobitz type I Wenckebach atrioventricular block     04/2011 after inferior MI  . Ischemic cardiomyopathy     2-D echo 04/26/11: EF 40-45%, inferoseptal akinesis and inferior hypokinesis, mild LAE  . Chronic systolic heart failure   . Atrial flutter     coumadin initiated 05/30/11 - to be followed by PCP    Past Surgical History  Procedure Date  . Appendectomy   . Tonsillectomy   . Knee arthroscopy     Right    Current Outpatient Prescriptions  Medication Sig Dispense Refill  . albuterol (PROVENTIL  HFA;VENTOLIN HFA) 108 (90 BASE) MCG/ACT inhaler Inhale 1 puff into the lungs every 6 (six) hours as needed. For shortness of breath.       Marland Kitchen aspirin 81 MG tablet Take 81 mg by mouth daily.      . budesonide-formoterol (SYMBICORT) 160-4.5 MCG/ACT inhaler Inhale 2 puffs into the lungs 2 (two) times daily.        . Cholecalciferol (VITAMIN D) 2000 UNITS CAPS Take 1 capsule by mouth daily.        . Coenzyme Q10 (CO Q-10) 75 MG CAPS Take 1 capsule by mouth daily.        . furosemide (LASIX) 20 MG tablet Take 1 tablet (20 mg total) by mouth daily.  30 tablet  6  . lisinopril (PRINIVIL,ZESTRIL) 10 MG tablet Take 1 tablet (10 mg total) by mouth daily.  30 tablet  6  . Multiple Vitamins-Minerals (MULTIVITAMINS THER. W/MINERALS) TABS Take 1 tablet by mouth daily.        . nitroGLYCERIN (NITROSTAT) 0.4 MG SL tablet Place 1 tablet (0.4 mg total) under the tongue every 5 (five) minutes as needed for chest pain.  25 tablet  3  . potassium chloride (K-DUR) 10 MEQ tablet Take 1 tablet (10 mEq total) by mouth daily.  30 tablet  6  . pravastatin (PRAVACHOL) 40 MG tablet Take 1 tablet (40 mg total) by mouth  daily.  30 tablet  6  . warfarin (COUMADIN) 5 MG tablet Take as directed  30 tablet  0    Allergies  Allergen Reactions  . Tiotropium Bromide Monohydrate     REACTION: constipation    Review of Systems negative except from HPI and PMH  Physical Exam BP 92/60  Pulse 69  Ht 6\' 1"  (1.854 m)  Wt 278 lb 1.9 oz (126.154 kg)  BMI 36.69 kg/m2 Well developed and well nourished in no acute distress HENT normal E scleral and icterus clear Neck Supple JVP flat; carotids brisk and full Clear to ausculation Regular rate and rhythm, no murmurs gallops or rub Soft with active bowel sounds No clubbing cyanosis none Edema Alert and oriented, grossly normal motor and sensory function Skin Warm and Dry   electrocardiogram demonstrates sinus rhythm at 69 Intervals 0.21/0.11/0.41 Axis is -60 Inferolateral MI  and inferolateral MI Assessment and  Plan

## 2011-06-28 NOTE — Assessment & Plan Note (Signed)
The patient has atrial fibrillation for which there no attributable symptoms. He is appropriately anticoagulated.

## 2011-06-28 NOTE — Patient Instructions (Addendum)
Your physician recommends that you schedule a follow-up appointment in: 3 months with Dr. Graciela Husbands.  Your physician has recommended you make the following change in your medication:  1) decrease lisinopril to 5 mg once daily.

## 2011-07-06 ENCOUNTER — Ambulatory Visit: Payer: Medicare Other | Admitting: Internal Medicine

## 2011-09-28 ENCOUNTER — Ambulatory Visit (INDEPENDENT_AMBULATORY_CARE_PROVIDER_SITE_OTHER): Payer: Medicare Other | Admitting: Internal Medicine

## 2011-09-28 ENCOUNTER — Encounter: Payer: Self-pay | Admitting: Internal Medicine

## 2011-09-28 VITALS — BP 136/82 | HR 78 | Ht 73.0 in | Wt 285.0 lb

## 2011-09-28 DIAGNOSIS — I10 Essential (primary) hypertension: Secondary | ICD-10-CM

## 2011-09-28 DIAGNOSIS — I255 Ischemic cardiomyopathy: Secondary | ICD-10-CM

## 2011-09-28 DIAGNOSIS — I5022 Chronic systolic (congestive) heart failure: Secondary | ICD-10-CM

## 2011-09-28 DIAGNOSIS — M353 Polymyalgia rheumatica: Secondary | ICD-10-CM

## 2011-09-28 DIAGNOSIS — IMO0001 Reserved for inherently not codable concepts without codable children: Secondary | ICD-10-CM

## 2011-09-28 DIAGNOSIS — I2589 Other forms of chronic ischemic heart disease: Secondary | ICD-10-CM

## 2011-09-28 DIAGNOSIS — I4891 Unspecified atrial fibrillation: Secondary | ICD-10-CM

## 2011-09-28 DIAGNOSIS — M791 Myalgia, unspecified site: Secondary | ICD-10-CM

## 2011-09-28 LAB — BASIC METABOLIC PANEL
BUN: 15 mg/dL (ref 6–23)
CO2: 29 mEq/L (ref 19–32)
Chloride: 102 mEq/L (ref 96–112)
Creatinine, Ser: 0.9 mg/dL (ref 0.4–1.5)

## 2011-09-28 NOTE — Assessment & Plan Note (Signed)
He manifests a volume overload. He had problems taking his diuretics on a daily basis because of cramping. We will try him on Lasix 20 mg every other day. She will let us know how he does. In the event that he does not tolerate this will

## 2011-09-28 NOTE — Assessment & Plan Note (Signed)
As above.

## 2011-09-28 NOTE — Progress Notes (Signed)
HPI  [name] is a 71 y.o. male  Seen in followup following recent hospitalization for a nonSTEMI related to a late presentation of an out-of-hospital MI it was elected to treat his totally occluded RCA medically. During his hospitalization he had atrial fibrillation that continued to recur and was followed on an event recorder with tachybradycardia manifestations.  He was seen last a couple of months ago. Unfortunately my note is incomplete and I'm not sure although I thought. His blood pressure little bit low and we decreased his ACE inhibitor.  He ended up stopping his diuretics on his own because it was associated with cramping. He also stopped his ACE inhibitor because he still had lightheadedness.  He also complains of aches in his proximal thighs and hip.  He's had some problems with fluid accumulation though he blames it on his long trip in the car from [place]. He denies significant shortness of breath and there has been no chest pain  Past Medical History  Diagnosis Date  . COPD (chronic obstructive pulmonary disease)   . Coronary artery disease     nstemi 10/2007 - LAD 95p (stented w/ 2.5 x 12mm Promus DES) , D2 99 small, RCA . - enrolled in Tracer Study/// 04/25/2011-[facility] MI - Occluded RCA, LAD stent patent.  Medical Rx.  . Gout   . Hyperlipidemia   . Tobacco abuse     Cigarettes x 30 yrs; now occasional cigars.  . Hypertension   . Obesity   . Diverticulosis   . Asthma   . GERD (gastroesophageal reflux disease)   . Hemorrhoids   . Obstructive sleep apnea   . Osteoarthritis   . Mobitz type I Wenckebach atrioventricular block     04/2011 after inferior MI  . Ischemic cardiomyopathy     2-D echo 04/26/11: EF 40-45%, inferoseptal akinesis and inferior hypokinesis, mild LAE  . Chronic systolic heart failure   . Atrial flutter     coumadin initiated 05/30/11 - to be followed by PCP    Past Surgical History  Procedure Date  . Appendectomy   .  Tonsillectomy   . Knee arthroscopy     Right    Current Outpatient Prescriptions  Medication Sig Dispense Refill  . albuterol (PROVENTIL HFA;VENTOLIN HFA) 108 (90 BASE) MCG/ACT inhaler Inhale 1 puff into the lungs every 6 (six) hours as needed. For shortness of breath.       Marland Kitchen aspirin 81 MG tablet Take 81 mg by mouth daily.      . budesonide-formoterol (SYMBICORT) 160-4.5 MCG/ACT inhaler Inhale 2 puffs into the lungs 2 (two) times daily.        . Cholecalciferol (VITAMIN D) 2000 UNITS CAPS Take 1 capsule by mouth daily.        . Coenzyme Q10 (CO Q-10) 75 MG CAPS Take 1 capsule by mouth daily.        Marland Kitchen lisinopril (PRINIVIL,ZESTRIL) 5 MG tablet Take 5 mg by mouth. Per pt takes prn when BP is high, monitors BP at home      . Multiple Vitamins-Minerals (MULTIVITAMINS THER. W/MINERALS) TABS Take 1 tablet by mouth daily.        . nitroGLYCERIN (NITROSTAT) 0.4 MG SL tablet Place 1 tablet (0.4 mg total) under the tongue every 5 (five) minutes as needed for chest pain.  25 tablet  3  . potassium chloride (K-DUR) 10 MEQ tablet Take 1 tablet (10 mEq total) by mouth daily.  30 tablet  6  . pravastatin (PRAVACHOL)  40 MG tablet Take 1 tablet (40 mg total) by mouth daily.  30 tablet  6  . warfarin (COUMADIN) 5 MG tablet Take as directed  45 tablet  3  . DISCONTD: lisinopril (PRINIVIL,ZESTRIL) 5 MG tablet Take 1 tablet (5 mg total) by mouth daily.  90 tablet  3  . furosemide (LASIX) 20 MG tablet Take 1 tablet (20 mg total) by mouth daily.  30 tablet  6    Allergies  Allergen Reactions  . Tiotropium Bromide Monohydrate     REACTION: constipation    Review of Systems negative except from HPI and PMH  Physical Exam BP 136/82  Pulse 78  Ht 6\' 1"  (1.854 m)  Wt 285 lb (129.275 kg)  BMI 37.60 kg/m2 Well developed and well nourished in no acute distress HENT normal E scleral and icterus clear Neck Supple JVP7-8 carotids brisk and full Clear to ausculation Regular rate and rhythm, no murmurs gallops  or rub Soft with active bowel sounds No clubbing cyanosis 1+ Edema Alert and oriented, grossly normal motor and sensory function Skin Warm and Dry  ECG: Sinus Rhythm  @78             Intervals  21/11/39  Axis -63  Inferior+ Lat MI     Assessment and  Plan

## 2011-09-28 NOTE — Patient Instructions (Addendum)
Your physician has recommended you make the following change in your medication:  1) decrease lasix to one tablet by mouth every other day.  Your physician recommends that you have lab work today: sed rate/bmp/magnesium  Your physician wants you to follow-up in: 6 months with Dr. Graciela Husbands. You will receive a reminder letter in the mail two months in advance. If you don't receive a letter, please call our office to schedule the follow-up appointment.

## 2011-09-28 NOTE — Assessment & Plan Note (Signed)
Not sure what this is but have recommended holding his statin and checking ESR as well as electrolytes

## 2011-09-28 NOTE — Assessment & Plan Note (Signed)
He was having problems with hypotension on his medications. As is described below, however, with his ischemic cardiomyopathy, I think is appropriate that he be on ACE inhibitors. We have reviewed these data. He'll try again on lisinopril 2.5 mg daily

## 2011-11-19 ENCOUNTER — Other Ambulatory Visit: Payer: Self-pay | Admitting: Internal Medicine

## 2011-12-19 IMAGING — CR DG CHEST 2V
2 series · 2 of 2 positions shown · non-contrast
Comparison: 11/05/2007

CLINICAL DATA: Myocardial infarction.  Weakness.

CHEST - 2 VIEW

[w chest pa]
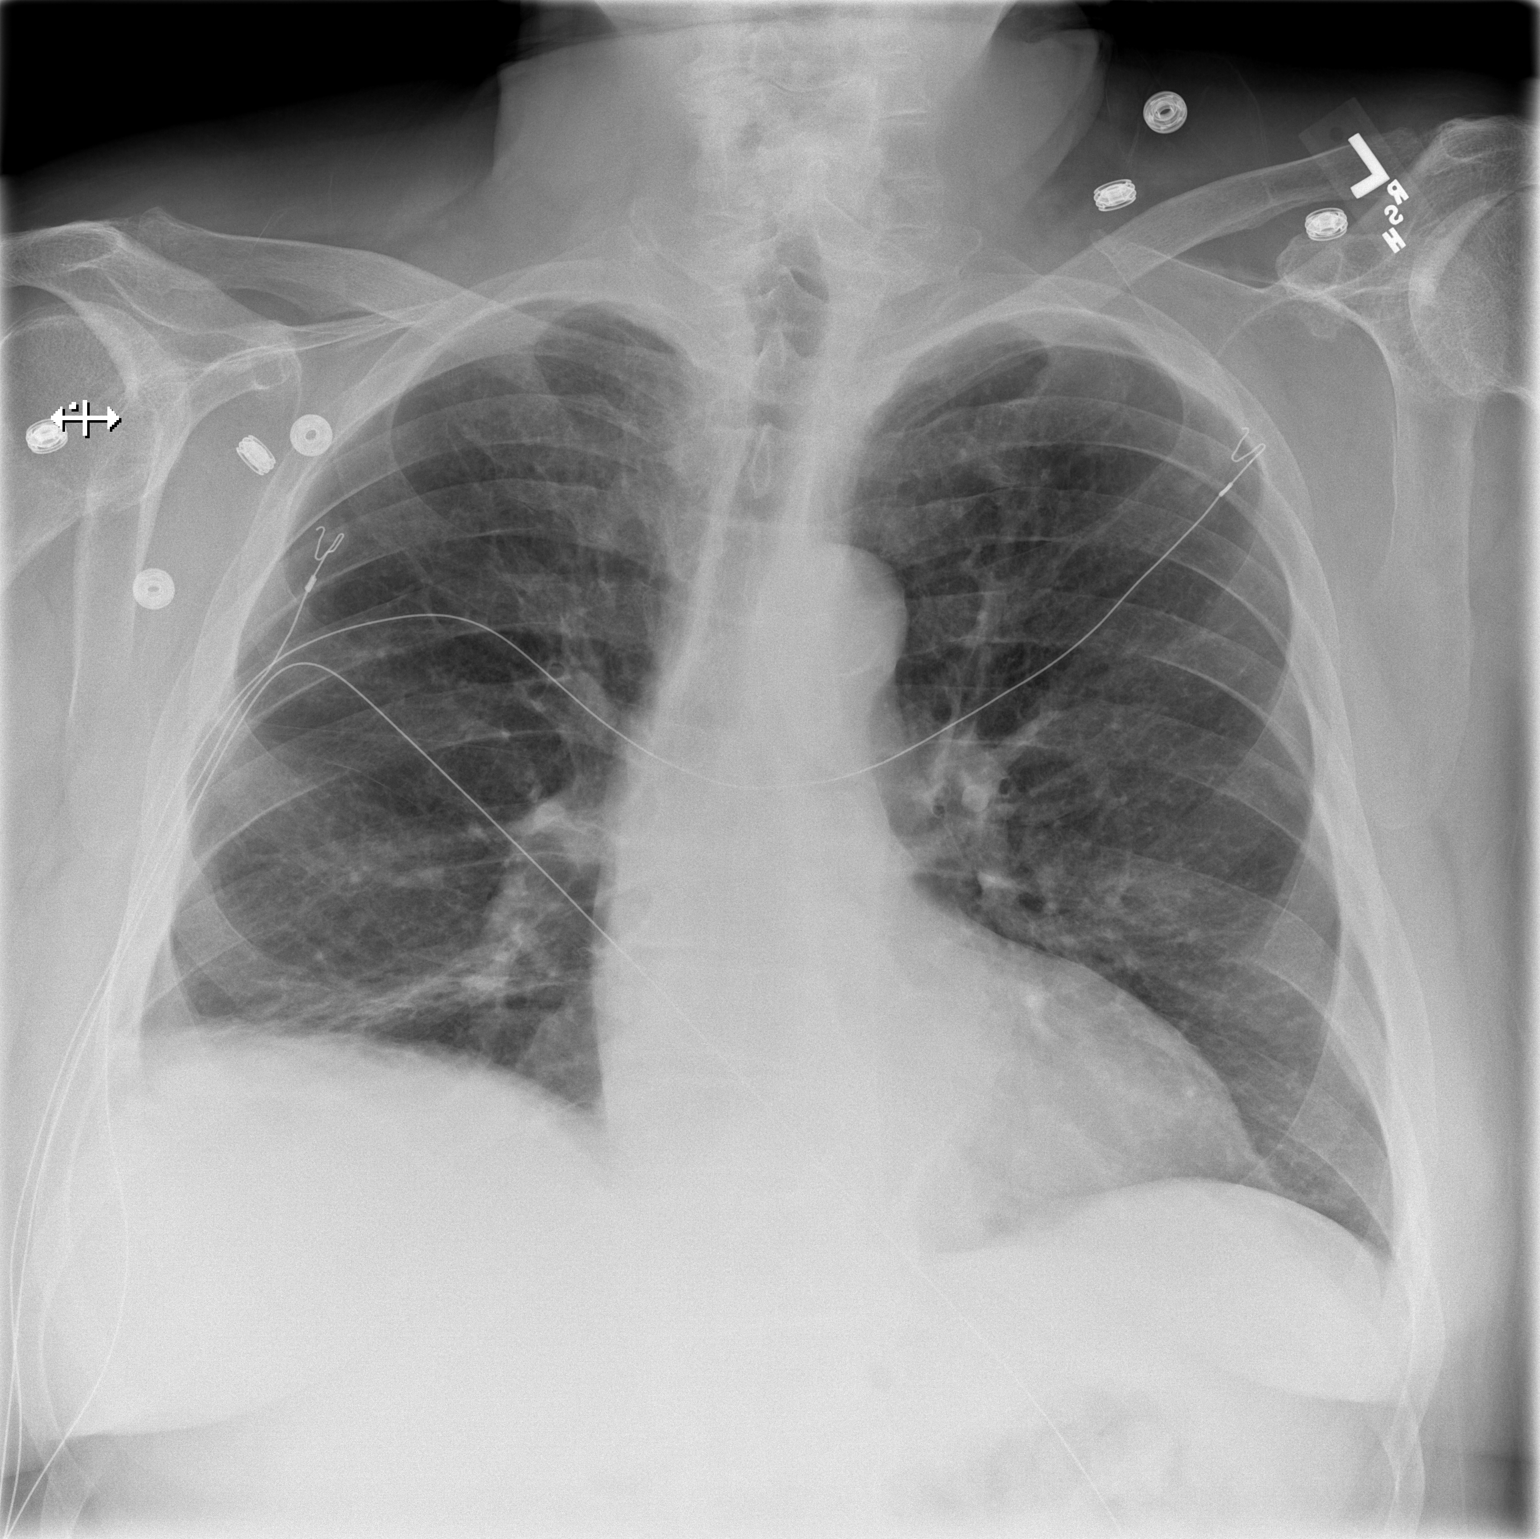

[w chest lat]
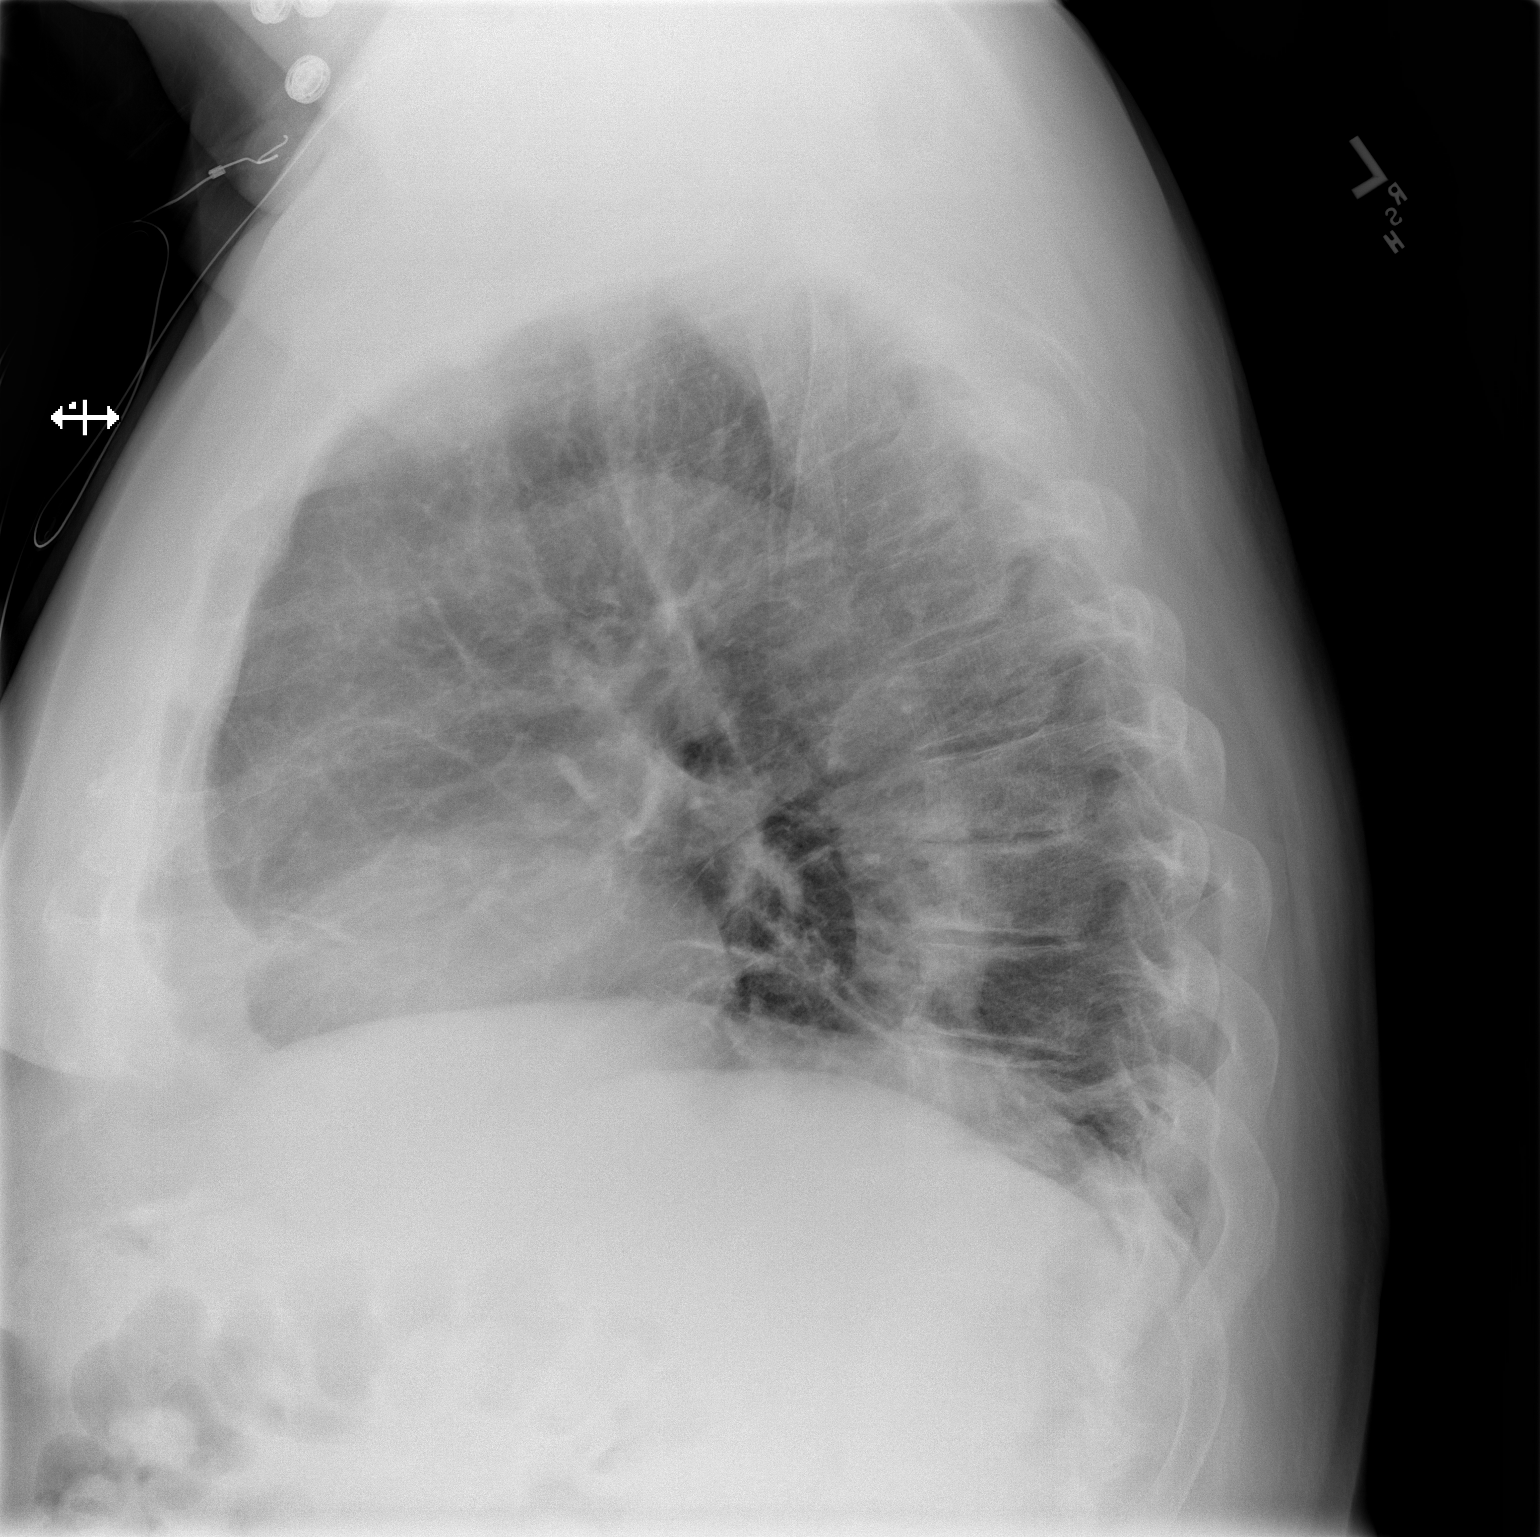

[2 of 2 positions shown; findings below may reference images not displayed]

FINDINGS: The heart size appears normal.

No pleural effusion or pulmonary edema.

Atelectasis is identified within the right lung base.

The left lung is clear.

Review of the visualized osseous structures is significant for mild
spondylosis.
IMPRESSION: 1.  Right lung base atelectasis.

## 2012-02-16 ENCOUNTER — Encounter: Payer: Self-pay | Admitting: Internal Medicine

## 2012-04-19 ENCOUNTER — Ambulatory Visit (INDEPENDENT_AMBULATORY_CARE_PROVIDER_SITE_OTHER): Payer: Medicare Other | Admitting: Internal Medicine

## 2012-04-19 VITALS — BP 147/92 | HR 79 | Resp 18 | Ht 73.0 in | Wt 268.0 lb

## 2012-04-19 DIAGNOSIS — I255 Ischemic cardiomyopathy: Secondary | ICD-10-CM

## 2012-04-19 DIAGNOSIS — R001 Bradycardia, unspecified: Secondary | ICD-10-CM

## 2012-04-19 DIAGNOSIS — I2589 Other forms of chronic ischemic heart disease: Secondary | ICD-10-CM

## 2012-04-19 DIAGNOSIS — I498 Other specified cardiac arrhythmias: Secondary | ICD-10-CM

## 2012-04-19 DIAGNOSIS — E785 Hyperlipidemia, unspecified: Secondary | ICD-10-CM

## 2012-04-19 NOTE — Patient Instructions (Signed)
Your physician wants you to follow-up in: 6 MONTHS WITH DR KLEIN You will receive a reminder letter in the mail two months in advance. If you don't receive a letter, please call our office to schedule the follow-up appointment.  

## 2012-04-19 NOTE — Assessment & Plan Note (Signed)
Continue to work to try to get him on taking him on ACE inhibitors. We'll try again later to get him on a beta blocker. Here to date he has been unwilling to care to date

## 2012-04-19 NOTE — Assessment & Plan Note (Signed)
Stable

## 2012-04-19 NOTE — Assessment & Plan Note (Signed)
Lipids pending

## 2012-04-19 NOTE — Progress Notes (Signed)
Patient Care Team: Hoyle Sauer, MD as PCP - General   HPI  [name] is a 71 y.o. male Seen in followup following recent hospitalization for a nonSTEMI related to a late presentation of an out-of-hospital MI it was elected to treat his totally occluded RCA medically. During his hospitalization he had atrial fibrillation that continued to recur and was followed on an event recorder with tachybradycardia manifestations    His continued to struggle with medications. He is trying lisinopril 2.5.   Chest pain shortness of breath and edema are all stable  .     Past Medical History  Diagnosis Date  . COPD (chronic obstructive pulmonary disease)   . Coronary artery disease     nstemi 10/2007 - LAD 95p (stented w/ 2.5 x 12mm Promus DES) , D2 99 small, RCA . - enrolled in Tracer Study/// 04/25/2011-[facility] MI - Occluded RCA, LAD stent patent.  Medical Rx.  . Gout   . Hyperlipidemia   . Tobacco abuse     Cigarettes x 30 yrs; now occasional cigars.  . Hypertension   . Obesity   . Diverticulosis   . Asthma   . GERD (gastroesophageal reflux disease)   . Hemorrhoids   . Obstructive sleep apnea   . Osteoarthritis   . Mobitz type I Wenckebach atrioventricular block     04/2011 after inferior MI  . Ischemic cardiomyopathy     2-D echo 04/26/11: EF 40-45%, inferoseptal akinesis and inferior hypokinesis, mild LAE  . Chronic systolic heart failure   . Atrial flutter     coumadin initiated 05/30/11 - to be followed by PCP    Past Surgical History  Procedure Date  . Appendectomy   . Tonsillectomy   . Knee arthroscopy     Right    Current Outpatient Prescriptions  Medication Sig Dispense Refill  . albuterol (PROVENTIL HFA;VENTOLIN HFA) 108 (90 BASE) MCG/ACT inhaler Inhale 1 puff into the lungs every 6 (six) hours as needed. For shortness of breath.       Marland Kitchen aspirin 81 MG tablet Take 81 mg by mouth daily.      . budesonide-formoterol (SYMBICORT) 160-4.5 MCG/ACT inhaler  Inhale 2 puffs into the lungs 2 (two) times daily.        . Cholecalciferol (VITAMIN D) 2000 UNITS CAPS Take 1 capsule by mouth daily.        . Coenzyme Q10 (CO Q-10) 75 MG CAPS Take 1 capsule by mouth daily.        . furosemide (LASIX) 20 MG tablet Take one tablet by mouth every other day      . lisinopril (PRINIVIL,ZESTRIL) 5 MG tablet Take 5 mg by mouth. Per pt takes prn when BP is high, monitors BP at home      . Multiple Vitamins-Minerals (MULTIVITAMINS THER. W/MINERALS) TABS Take 1 tablet by mouth daily.        . nitroGLYCERIN (NITROSTAT) 0.4 MG SL tablet Place 1 tablet (0.4 mg total) under the tongue every 5 (five) minutes as needed for chest pain.  25 tablet  3  . potassium chloride (K-DUR) 10 MEQ tablet Take 1 tablet (10 mEq total) by mouth daily.  30 tablet  6  . pravastatin (PRAVACHOL) 40 MG tablet Take 1 tablet (40 mg total) by mouth daily.  30 tablet  6  . warfarin (COUMADIN) 5 MG tablet Take as directed  45 tablet  3    Allergies  Allergen Reactions  . Tiotropium Bromide Monohydrate  REACTION: constipation    Review of Systems negative except from HPI and PMH  Physical Exam BP 147/92  Pulse 79  Resp 18  Ht 6\' 1"  (1.854 m)  Wt 268 lb (121.564 kg)  BMI 35.36 kg/m2  SpO2 96% Well developed and well nourished in no acute distress HENT normal E scleral and icterus clear Neck Supple JVP flat; carotids brisk and full Clear to ausculation  +s4Regular rate and rhythm, no murmurs gallops or rub Soft with active bowel sounds No clubbing cyanosis none Edema Alert and oriented, grossly normal motor and sensory function Skin Warm and Dry  Electrocardiogram demonstrates sinus rhythm at 80 Intervals 28/11/41 Axis of -30 I MI poor R wave progression  Assessment and  Plan

## 2012-05-10 NOTE — Addendum Note (Signed)
Addended by: Lacie Scotts on: 05/10/2012 03:23 PM   Modules accepted: Orders

## 2012-06-06 NOTE — Addendum Note (Signed)
Addended by: Lacie Scotts on: 06/06/2012 04:48 PM   Modules accepted: Orders

## 2012-09-11 ENCOUNTER — Encounter: Payer: Self-pay | Admitting: Internal Medicine

## 2012-10-17 ENCOUNTER — Encounter: Payer: Self-pay | Admitting: Internal Medicine

## 2012-10-17 ENCOUNTER — Ambulatory Visit (INDEPENDENT_AMBULATORY_CARE_PROVIDER_SITE_OTHER): Payer: Medicare Other | Admitting: Internal Medicine

## 2012-10-17 VITALS — BP 131/85 | HR 62 | Ht 74.0 in | Wt 261.0 lb

## 2012-10-17 DIAGNOSIS — I2510 Atherosclerotic heart disease of native coronary artery without angina pectoris: Secondary | ICD-10-CM

## 2012-10-17 DIAGNOSIS — I4891 Unspecified atrial fibrillation: Secondary | ICD-10-CM

## 2012-10-17 DIAGNOSIS — I498 Other specified cardiac arrhythmias: Secondary | ICD-10-CM

## 2012-10-17 DIAGNOSIS — I5022 Chronic systolic (congestive) heart failure: Secondary | ICD-10-CM

## 2012-10-17 DIAGNOSIS — R001 Bradycardia, unspecified: Secondary | ICD-10-CM

## 2012-10-17 NOTE — Progress Notes (Signed)
Patient Care Team: Hoyle Sauer, MD as PCP - General   HPI  [name] is a 72 y.o. male Seen in followup   for a nonSTEMI related to a late presentation of an out-of-hospital MI 2012.  He has had no significant tachypalpitations He denies chest pain or shortness of breath although his effort is limited because of  his knee  LHC 04/25/11: LM 10%, LAD stent patent, mid LAD 40%, CFX scattered 20%, proximal RCA occluded, inferior HK, EF 40-45%. It was felt that he had completed his inferior MI and was managed medically. 2-D echo 04/26/11: EF 40-45%, inferoseptal akinesis and inferior hypokinesis, mild LAE   During his hospitalization he had atrial fibrillation that continued to recur and was followed on an event recorder with documented atrial fibrillation to HR 200  Past Medical History  Diagnosis Date  . COPD (chronic obstructive pulmonary disease)   . Coronary artery disease     nstemi 10/2007 - LAD 95p (stented w/ 2.5 x 12mm Promus DES) , D2 99 small, RCA . - enrolled in Tracer Study/// 04/25/2011-[facility] MI - Occluded RCA, LAD stent patent.  Medical Rx.  . Gout   . Hyperlipidemia   . Tobacco abuse     Cigarettes x 30 yrs; now occasional cigars.  . Hypertension   . Obesity   . Diverticulosis   . Asthma   . GERD (gastroesophageal reflux disease)   . Hemorrhoids   . Obstructive sleep apnea   . Osteoarthritis   . Mobitz type I Wenckebach atrioventricular block     04/2011 after inferior MI  . Ischemic cardiomyopathy     2-D echo 04/26/11: EF 40-45%, inferoseptal akinesis and inferior hypokinesis, mild LAE  . Chronic systolic heart failure   . Atrial flutter     coumadin initiated 05/30/11 - to be followed by PCP    Past Surgical History  Procedure Laterality Date  . Appendectomy    . Tonsillectomy    . Knee arthroscopy      Right    Current Outpatient Prescriptions  Medication Sig Dispense Refill  . albuterol (PROVENTIL HFA;VENTOLIN HFA) 108 (90 BASE)  MCG/ACT inhaler Inhale 1 puff into the lungs every 6 (six) hours as needed. For shortness of breath.       . budesonide-formoterol (SYMBICORT) 160-4.5 MCG/ACT inhaler Inhale 2 puffs into the lungs 2 (two) times daily.        . Cholecalciferol (VITAMIN D) 2000 UNITS CAPS Take 1 capsule by mouth daily.        . Coenzyme Q10 (CO Q-10) 75 MG CAPS Take 1 capsule by mouth daily.        . Multiple Vitamins-Minerals (MULTIVITAMINS THER. W/MINERALS) TABS Take 1 tablet by mouth daily.        . nitroGLYCERIN (NITROSTAT) 0.4 MG SL tablet Place 1 tablet (0.4 mg total) under the tongue every 5 (five) minutes as needed for chest pain.  25 tablet  3  . warfarin (COUMADIN) 5 MG tablet Take as directed  45 tablet  3  . aspirin 81 MG tablet Take 81 mg by mouth daily.       No current facility-administered medications for this visit.    Allergies  Allergen Reactions  . Tiotropium Bromide Monohydrate     REACTION: constipation    Review of Systems negative except from HPI and PMH  Physical Exam BP 131/85  Pulse 62  Ht 6\' 2"  (1.88 m)  Wt 261 lb (118.389 kg)  BMI 33.5  kg/m2 Well developed and nourished in no acute distress HENT normal Neck supple with JVP-flat Clear Regular rate and rhythm, S1 is soft early systolic murmur Abd-soft with active BS No Clubbing cyanosis edema; swelling of his right knee Skin-warm and dry A & Oriented  Grossly normal sensory and motor function   ECG demonstrates sinus rhythm at 62 intervals 21/12/42 Axis is leftward 0.28  Assessment and  Plan

## 2012-10-17 NOTE — Patient Instructions (Signed)
Your physician wants you to follow-up in: 6 months with Dr. Klein. You will receive a reminder letter in the mail two months in advance. If you don't receive a letter, please call our office to schedule the follow-up appointment.  Your physician recommends that you continue on your current medications as directed. Please refer to the Current Medication list given to you today.  

## 2012-10-17 NOTE — Assessment & Plan Note (Signed)
No Clinical recurrences

## 2012-10-17 NOTE — Assessment & Plan Note (Signed)
Stable continue current meds 

## 2012-10-17 NOTE — Assessment & Plan Note (Signed)
Seems to be stable and not resposnible for limited activity

## 2012-10-17 NOTE — Assessment & Plan Note (Signed)
euvolemic continue meds 

## 2013-03-13 ENCOUNTER — Encounter: Payer: Self-pay | Admitting: Cardiovascular Disease

## 2013-04-28 ENCOUNTER — Other Ambulatory Visit: Payer: Self-pay | Admitting: Internal Medicine

## 2013-04-28 DIAGNOSIS — J189 Pneumonia, unspecified organism: Secondary | ICD-10-CM

## 2013-04-30 ENCOUNTER — Ambulatory Visit
Admission: RE | Admit: 2013-04-30 | Discharge: 2013-04-30 | Disposition: A | Discharge status: Home / Self Care | Payer: Medicare Other | Source: Ambulatory Visit | Attending: Internal Medicine | Admitting: Internal Medicine

## 2013-04-30 DIAGNOSIS — J189 Pneumonia, unspecified organism: Secondary | ICD-10-CM

## 2013-05-16 ENCOUNTER — Ambulatory Visit: Payer: Medicare Other | Admitting: Internal Medicine

## 2013-05-30 ENCOUNTER — Encounter: Payer: Self-pay | Admitting: Internal Medicine

## 2013-11-13 ENCOUNTER — Encounter: Payer: Self-pay | Admitting: Internal Medicine

## 2013-11-13 ENCOUNTER — Ambulatory Visit (INDEPENDENT_AMBULATORY_CARE_PROVIDER_SITE_OTHER): Payer: Medicare Other | Admitting: Internal Medicine

## 2013-11-13 VITALS — BP 149/92 | HR 80 | Ht 72.0 in | Wt 245.0 lb

## 2013-11-13 DIAGNOSIS — I5022 Chronic systolic (congestive) heart failure: Secondary | ICD-10-CM

## 2013-11-13 DIAGNOSIS — I480 Paroxysmal atrial fibrillation: Secondary | ICD-10-CM

## 2013-11-13 DIAGNOSIS — R001 Bradycardia, unspecified: Secondary | ICD-10-CM

## 2013-11-13 DIAGNOSIS — I4891 Unspecified atrial fibrillation: Secondary | ICD-10-CM

## 2013-11-13 DIAGNOSIS — I498 Other specified cardiac arrhythmias: Secondary | ICD-10-CM

## 2013-11-13 NOTE — Progress Notes (Signed)
Patient Care Team: Tivis Ringer, MD as PCP - General   HPI  [name] is a 73 y.o. male Seen in followup   for a nonSTEMI related to a late presentation of an out-of-hospital MI 2012.  He has had no significant tachypalpitations He denies chest pain or shortness of breath although his effort is limited because of  his knee  LHC 04/25/11: LM 10%, LAD stent patent, mid LAD 40%, CFX scattered 20%, proximal RCA occluded, inferior HK, EF 40-45%. It was felt that he had completed his inferior MI and was managed medically. 2-D echo 04/26/11: EF 40-45%, inferoseptal akinesis and inferior hypokinesis, mild LAE   During his hospitalization he had atrial fibrillation that continued to recur and was followed on an event recorder with documented atrial fibrillation to HR 200   Past Medical History  Diagnosis Date  . COPD (chronic obstructive pulmonary disease)   . Coronary artery disease     nstemi 10/2007 - LAD 95p (stented w/ 2.5 x 40mm Promus DES) , D2 99 small, RCA 45mid. - enrolled in Tracer Study/// 04/25/2011-[facility] MI - Occluded RCA, LAD stent patent.  Medical Rx.  . Gout   . Hyperlipidemia   . Tobacco abuse     Cigarettes x 30 yrs; now occasional cigars.  . Hypertension   . Obesity   . Diverticulosis   . Asthma   . GERD (gastroesophageal reflux disease)   . Hemorrhoids   . Obstructive sleep apnea   . Osteoarthritis   . Mobitz type I Wenckebach atrioventricular block     04/2011 after inferior MI  . Ischemic cardiomyopathy     2-D echo 04/26/11: EF 40-45%, inferoseptal akinesis and inferior hypokinesis, mild LAE  . Chronic systolic heart failure   . Atrial flutter     coumadin initiated 05/30/11 - to be followed by PCP    Past Surgical History  Procedure Laterality Date  . Appendectomy    . Tonsillectomy    . Knee arthroscopy      Right    Current Outpatient Prescriptions  Medication Sig Dispense Refill  . albuterol (PROVENTIL HFA;VENTOLIN HFA) 108  (90 BASE) MCG/ACT inhaler Inhale 1 puff into the lungs every 6 (six) hours as needed. For shortness of breath.       . budesonide-formoterol (SYMBICORT) 160-4.5 MCG/ACT inhaler Inhale 2 puffs into the lungs 2 (two) times daily.        . Cholecalciferol (VITAMIN D) 2000 UNITS CAPS Take 1 capsule by mouth daily.        . Coenzyme Q10 (CO Q-10) 75 MG CAPS Take 1 capsule by mouth daily.        . Multiple Vitamins-Minerals (MULTIVITAMINS THER. W/MINERALS) TABS Take 1 tablet by mouth daily.        Marland Kitchen warfarin (COUMADIN) 5 MG tablet Take as directed  45 tablet  3  . nitroGLYCERIN (NITROSTAT) 0.4 MG SL tablet Place 1 tablet (0.4 mg total) under the tongue every 5 (five) minutes as needed for chest pain.  25 tablet  3   No current facility-administered medications for this visit.    Allergies  Allergen Reactions  . Tiotropium Bromide Monohydrate     REACTION: constipation    Review of Systems negative except from HPI and PMH  Physical Exam BP 149/92  Pulse 80  Ht 6' (1.829 m)  Wt 245 lb (111.131 kg)  BMI 33.22 kg/m2 Well developed and nourished in no acute distress HENT normal Neck  supple with JVP-flat Clear Regular rate and rhythm, no murmurs or gallops Abd-soft with active BS No Clubbing cyanosis edema Skin-warm and dry A & Oriented  Grossly normal sensory and motor function   ECG demonstrates sinus rhythm at 80 intervals 21/12/41 excellent axis leftward at -8 Prior inferior wall MI Poor R-wave progression  Assessment and  Plan  Ischemic cardiomyopathy  hypertension  Atrial fibrillation   the patient is currently taking warfarin. We discussed the potential value of a NOAC. He will followup with the [facility] when he sees him in the fall to discuss using one as an alternative to his warfarin  We will reassess left ventricular function. He has stopped taking his ACE inhibitor  We will resume his left ventricular function remains impaired and consider the adjunctive use of a beta  blocker.  Is also help with his hypertension.  He is also not on statin therapy. I will refer this to his PCP for further discussions

## 2013-11-13 NOTE — Patient Instructions (Addendum)
Your physician recommends that you continue on your current medications as directed. Please refer to the Current Medication list given to you today.  Your physician has requested that you have an echocardiogram. Echocardiography is a painless test that uses sound waves to create images of your heart. It provides your doctor with information about the size and shape of your heart and how well your heart's chambers and valves are working. This procedure takes approximately one hour. There are no restrictions for this procedure.  Your physician wants you to follow-up in: 1 year with Dr. Klein.  You will receive a reminder letter in the mail two months in advance. If you don't receive a letter, please call our office to schedule the follow-up appointment.  

## 2013-12-01 ENCOUNTER — Ambulatory Visit (HOSPITAL_COMMUNITY): Payer: Medicare Other | Attending: Internal Medicine | Admitting: Radiology

## 2013-12-01 DIAGNOSIS — I480 Paroxysmal atrial fibrillation: Secondary | ICD-10-CM

## 2013-12-01 DIAGNOSIS — I5022 Chronic systolic (congestive) heart failure: Secondary | ICD-10-CM

## 2013-12-01 DIAGNOSIS — I509 Heart failure, unspecified: Secondary | ICD-10-CM

## 2013-12-01 DIAGNOSIS — I4891 Unspecified atrial fibrillation: Secondary | ICD-10-CM | POA: Insufficient documentation

## 2013-12-01 DIAGNOSIS — I4892 Unspecified atrial flutter: Secondary | ICD-10-CM | POA: Insufficient documentation

## 2013-12-01 DIAGNOSIS — J4489 Other specified chronic obstructive pulmonary disease: Secondary | ICD-10-CM | POA: Insufficient documentation

## 2013-12-01 DIAGNOSIS — I2589 Other forms of chronic ischemic heart disease: Secondary | ICD-10-CM | POA: Insufficient documentation

## 2013-12-01 DIAGNOSIS — I252 Old myocardial infarction: Secondary | ICD-10-CM | POA: Insufficient documentation

## 2013-12-01 DIAGNOSIS — I10 Essential (primary) hypertension: Secondary | ICD-10-CM | POA: Insufficient documentation

## 2013-12-01 DIAGNOSIS — I498 Other specified cardiac arrhythmias: Secondary | ICD-10-CM | POA: Insufficient documentation

## 2013-12-01 DIAGNOSIS — I255 Ischemic cardiomyopathy: Secondary | ICD-10-CM

## 2013-12-01 DIAGNOSIS — G4733 Obstructive sleep apnea (adult) (pediatric): Secondary | ICD-10-CM | POA: Insufficient documentation

## 2013-12-01 DIAGNOSIS — E669 Obesity, unspecified: Secondary | ICD-10-CM | POA: Insufficient documentation

## 2013-12-01 DIAGNOSIS — F17200 Nicotine dependence, unspecified, uncomplicated: Secondary | ICD-10-CM | POA: Insufficient documentation

## 2013-12-01 DIAGNOSIS — I517 Cardiomegaly: Secondary | ICD-10-CM | POA: Insufficient documentation

## 2013-12-01 DIAGNOSIS — I440 Atrioventricular block, first degree: Secondary | ICD-10-CM | POA: Insufficient documentation

## 2013-12-01 DIAGNOSIS — J449 Chronic obstructive pulmonary disease, unspecified: Secondary | ICD-10-CM | POA: Insufficient documentation

## 2013-12-01 DIAGNOSIS — R001 Bradycardia, unspecified: Secondary | ICD-10-CM

## 2013-12-01 NOTE — Progress Notes (Signed)
Echocardiogram performed.  

## 2013-12-19 ENCOUNTER — Telehealth: Payer: Self-pay | Admitting: Internal Medicine

## 2013-12-19 DIAGNOSIS — I2589 Other forms of chronic ischemic heart disease: Secondary | ICD-10-CM

## 2013-12-19 NOTE — Telephone Encounter (Signed)
°  Patient would like Echo results, please call and advise.

## 2013-12-23 IMAGING — CT CT CHEST W/O CM
2 of 4 series · 15 of 36 positions shown, 18 images · non-contrast
Comparison: Chest x-ray 04/26/2011.

CLINICAL DATA: Followup pneumonia.

EXAM:
CT CHEST WITHOUT CONTRAST
TECHNIQUE: Multidetector CT imaging of the chest was performed following the
standard protocol without IV contrast.

[Series 3: chest w/o · axial · non-contrast · 0.90mm/px · z∈[-320,-30]mm · 12 of 70 slices shown, 15 images]
[im 6/70  mediastinal]
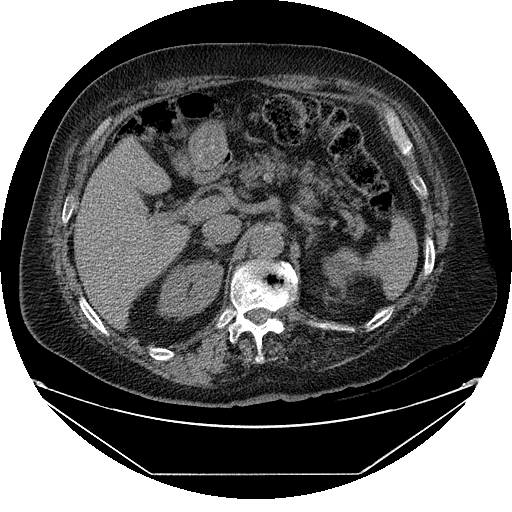
[im 6/70  lung]
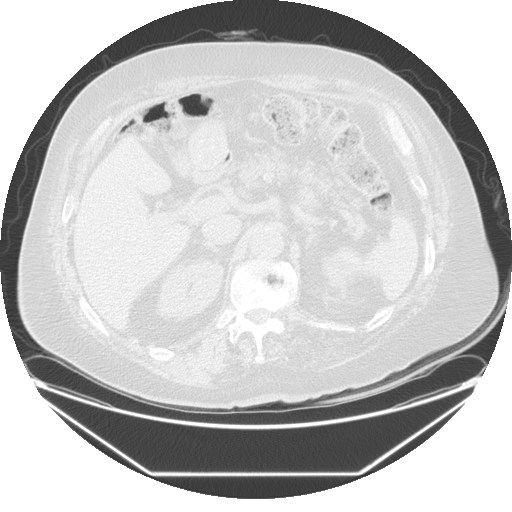
[im 11/70  lung]
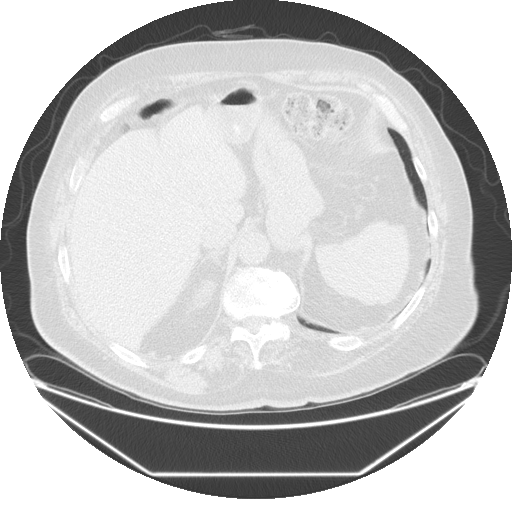
[im 16/70  lung]
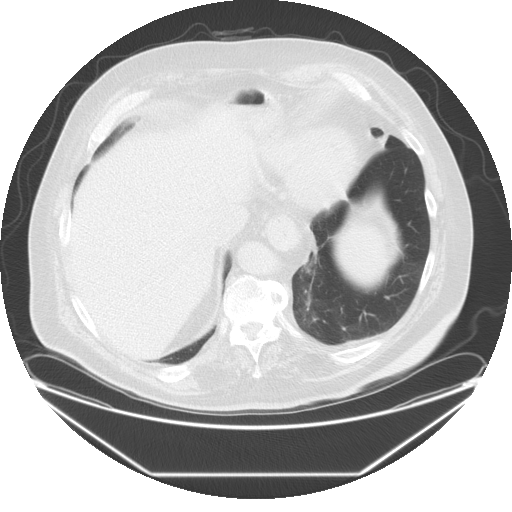
[im 22/70  lung]
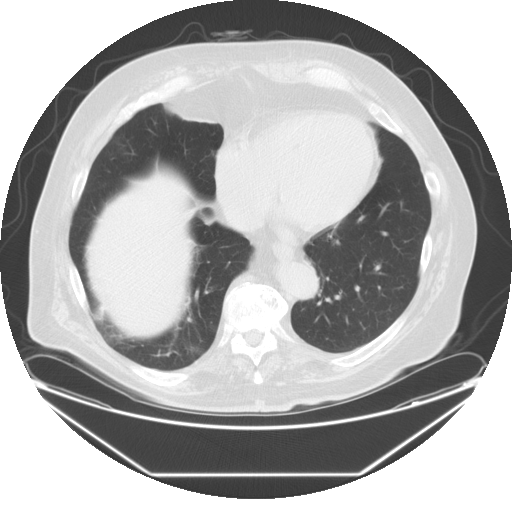
[im 27/70  mediastinal]
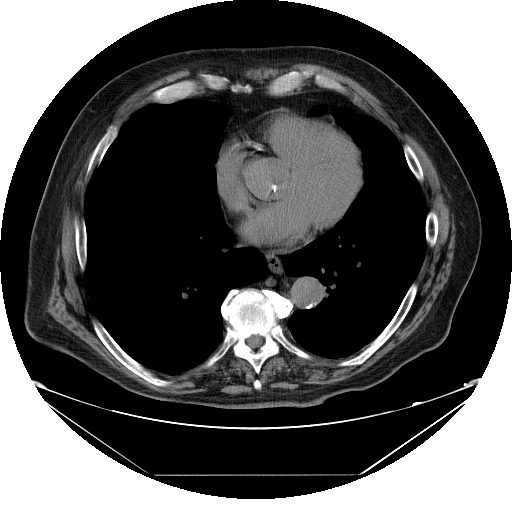
[im 27/70  lung]
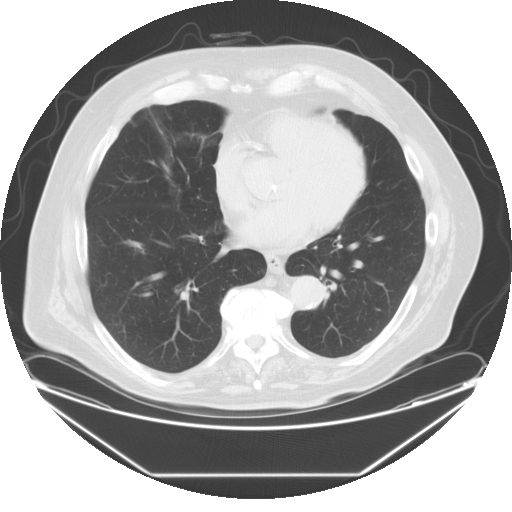
[im 32/70  lung]
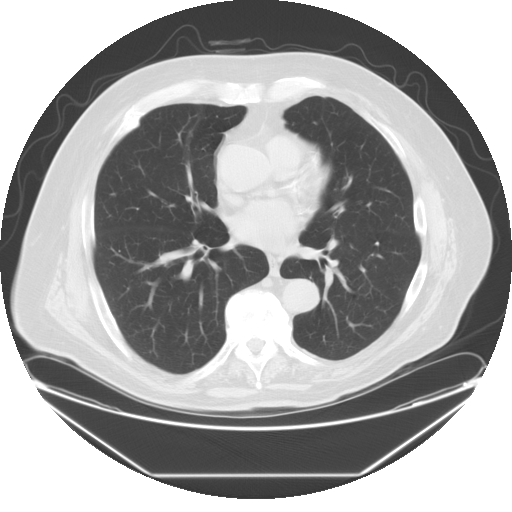
[im 38/70  lung]
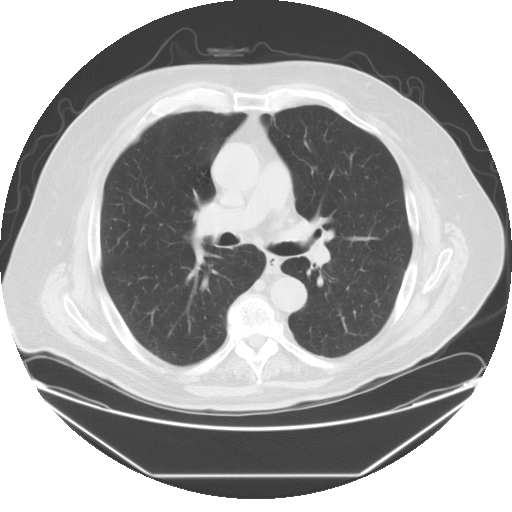
[im 43/70  lung]
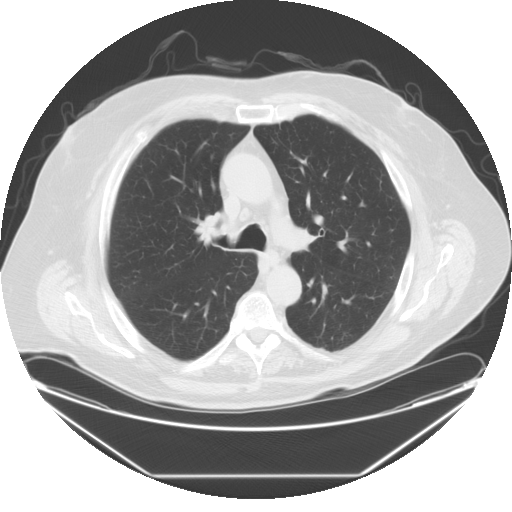
[im 48/70  mediastinal]
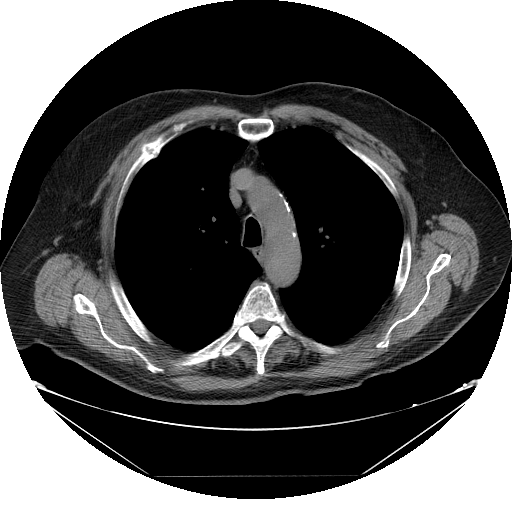
[im 48/70  lung]
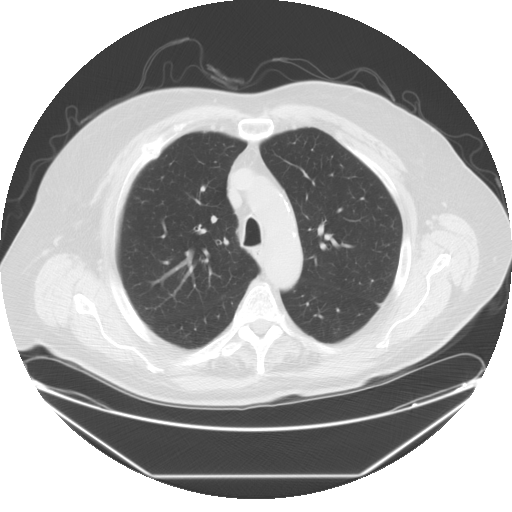
[im 54/70  lung]
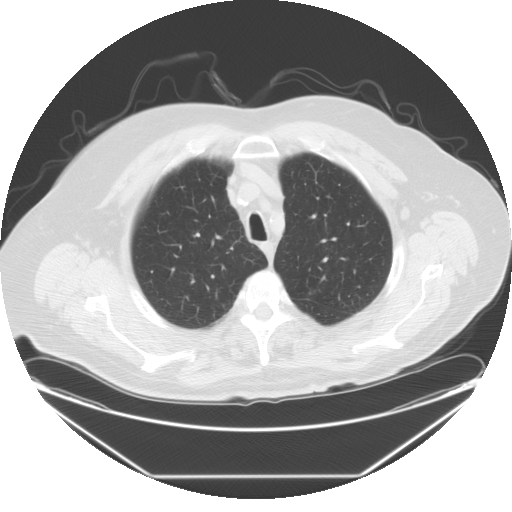
[im 59/70  lung]
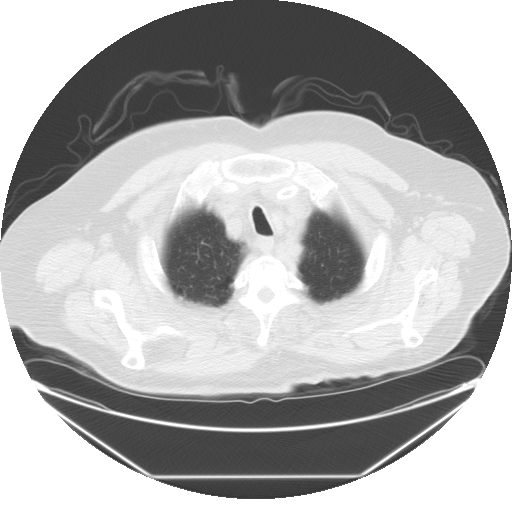
[im 64/70  lung]
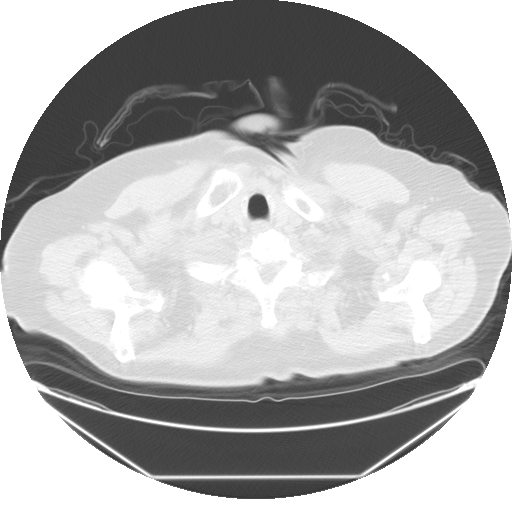

[Series 500: cor · coronal · 0.90mm/px · 3 of 150 slices shown]
[im 30/150  lung]
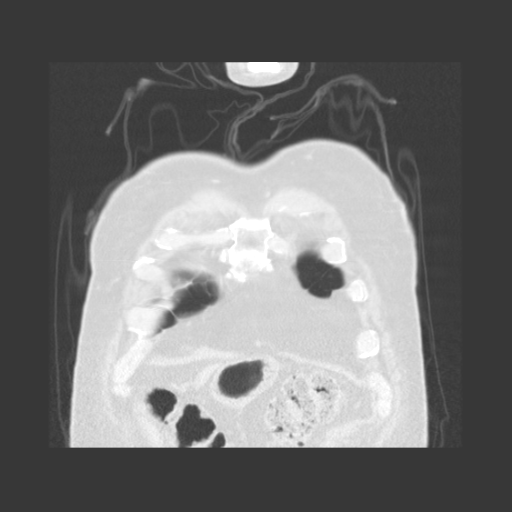
[im 60/150  lung]
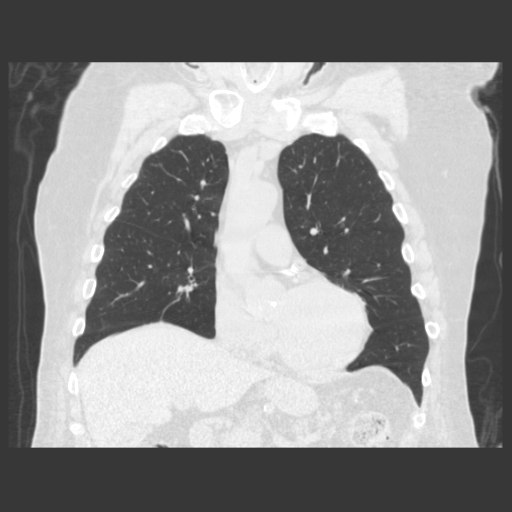
[im 90/150  lung]
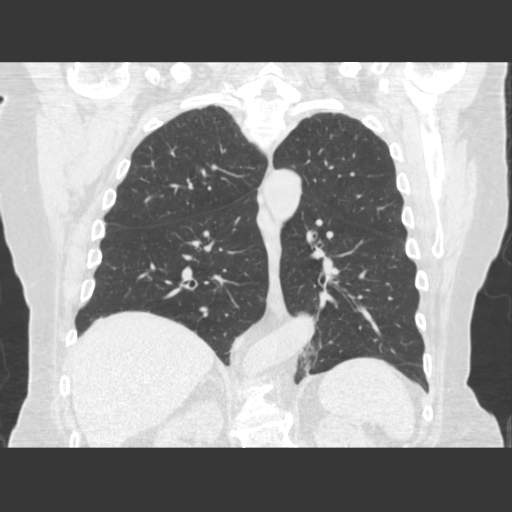

[15 of 36 positions shown; findings below may reference images not displayed]

FINDINGS: The chest wall is unremarkable. No supraclavicular or axillary mass
or adenopathy. The thyroid gland is unremarkable. The bony thorax is
intact. No destructive bone lesions or spinal canal compromise.
Moderate degenerative changes involving the thoracic spine.
Interbody fusion changes are noted at T7-8. Remote healed right
anterior rib fractures are noted.

The heart is normal in size. No pericardial effusion. Small
scattered mediastinal and hilar lymph nodes but no mass or
adenopathy. The aorta is normal in caliber. Scattered
atherosclerotic calcifications. Coronary artery calcifications are
also noted. The esophagus is grossly normal. A small to
moderate-sized hiatal hernia is noted.

Examination of the lung parenchyma demonstrates emphysematous
changes. There are minimal scarring type changes in the right middle
lobe and in both lower lobes but no infiltrates, edema or effusions.
No interstitial lung disease or bronchiectasis. A few small
calcified granulomas are noted but no worrisome pulmonary mass
lesions are nodules.

The upper abdomen is unremarkable except for right renal calculi.
IMPRESSION: 1. Mild emphysematous changes and minimal streaky scarring but no
infiltrate, edema or effusion.
2. Scattered aortic and coronary artery calcifications.
3. Small scattered calcified granulomas.

## 2013-12-23 NOTE — Telephone Encounter (Signed)
Left message for call back. Can view Dr.Klein's comments concerning echo results on the desk top of Ramsey RN.

## 2013-12-25 NOTE — Telephone Encounter (Signed)
Notes from Dr Caryl Comes: Please Inform Patient that echo is abnormal and he should resume his lsisinopirl and begin on coreg 3/125 He will need bmet in about 2 weeks Thanks

## 2013-12-26 NOTE — Telephone Encounter (Signed)
Left message on machine for pt to contact the office.   

## 2013-12-29 MED ORDER — LISINOPRIL 5 MG PO TABS: 5.0000 mg | ORAL_TABLET | Freq: Every day | ORAL | Status: DC

## 2013-12-29 MED ORDER — CARVEDILOL 3.125 MG PO TABS: 3.1250 mg | ORAL_TABLET | Freq: Two times a day (BID) | ORAL | Status: DC

## 2013-12-29 NOTE — Telephone Encounter (Signed)
I spoke with the patient. He is aware of his echo report and Dr. Olin Pia recommendations to re-start lisinopril and start coreg 3.125 mg BID. The patient does not recall what previous dose of lisinopril he was taking. He does recall this was stopped due to his BP running low. I have advised that he start coreg 3.125 mg BID first and monitor his BP readings for about 3-4 days. I have explained that if his BP drops too much on the coreg alone, he should not start the lisinopril. Otherwise, if his readings are ok, he will start lisinopril 5 mg once daily. He may need to start out with a 1/2 pill once daily then increase to 5 mg once daily. He will notify us when he starts this so we know when he should come back for blood work. He voices understanding and is agreeable.

## 2013-12-29 NOTE — Telephone Encounter (Signed)
Follow up:     Pt returning this office's call please give him a call back.

## 2014-01-26 NOTE — Telephone Encounter (Signed)
Called to see if ever start Lisinopril, and patient tells me that he has not. SBP 130s, DBP 70-80s  Patient hesitant to start this med again, secondary to last time BP dropping very low. I explained that I would forward to Dr. Caryl Comes for his review. Pt is agreeable to this.

## 2014-02-04 NOTE — Telephone Encounter (Signed)
lmtcb  (Per Dr. Caryl Comes, increase (double) Carvedilol to 6.25 mg BID)

## 2014-02-06 NOTE — Telephone Encounter (Signed)
Informed patient to increase Carvedilol to 6.25 mg twice daily. He will double up on the pills he currently has, make sure he tolerates increase, and will call back for new refill once he knows he can tolerate medication.  Patient verbalized understanding and agreeable to plan.

## 2014-04-22 ENCOUNTER — Encounter (HOSPITAL_COMMUNITY): Payer: Self-pay | Admitting: Cardiology

## 2014-07-07 ENCOUNTER — Telehealth: Payer: Self-pay | Admitting: Internal Medicine

## 2014-07-07 NOTE — Telephone Encounter (Signed)
Pt c/o swelling: STAT is pt has developed SOB within 24 hours  1. How long have you been experiencing swelling? Intermittently for several weeks   2. Where is the swelling located? Around his ankles  3.  Are you currently taking a "fluid pill"?he did last night and it did reduce.Marland Kitchen Bp was elevated its within normal limits now... No readings listed 4.  Are you currently SOB? No   5.  Have you traveled recently?No, But he has been walking from [place] deck to the ward.. Not sure if that has anything to do with the swelling. Its more so in the evening.

## 2014-07-07 NOTE — Telephone Encounter (Signed)
Patient tells me same as described below.  He states that he recently began walking with his wife who is in cardiac rehab - and ankle edema started after beginning this.  Advised to follow up with PCP, as Dr. Caryl Comes is on vacation and will not return until next week.  Discussed that if they felt this is heart related to call me back and I will scheduled OV with Caryl Comes or a PA. Patient verbalized understanding and agreeable to plan.

## 2014-08-07 ENCOUNTER — Other Ambulatory Visit: Payer: Self-pay | Admitting: Internal Medicine

## 2015-01-26 ENCOUNTER — Ambulatory Visit (INDEPENDENT_AMBULATORY_CARE_PROVIDER_SITE_OTHER): Payer: Medicare Other | Admitting: Internal Medicine

## 2015-01-26 ENCOUNTER — Telehealth: Payer: Self-pay

## 2015-01-26 ENCOUNTER — Encounter: Payer: Self-pay | Admitting: Internal Medicine

## 2015-01-26 VITALS — BP 134/96 | HR 77 | Ht 72.0 in | Wt 255.2 lb

## 2015-01-26 DIAGNOSIS — I480 Paroxysmal atrial fibrillation: Secondary | ICD-10-CM | POA: Diagnosis not present

## 2015-01-26 DIAGNOSIS — I255 Ischemic cardiomyopathy: Secondary | ICD-10-CM | POA: Diagnosis not present

## 2015-01-26 MED ORDER — CARVEDILOL 3.125 MG PO TABS: 3.1250 mg | ORAL_TABLET | Freq: Two times a day (BID) | ORAL | Status: AC

## 2015-01-26 MED ORDER — NITROGLYCERIN 0.4 MG SL SUBL
0.4000 mg | SUBLINGUAL_TABLET | SUBLINGUAL | Status: AC | PRN
Start: 2015-01-26 — End: 2018-10-23

## 2015-01-26 NOTE — Telephone Encounter (Signed)
Called pt. Received request to refill Coumadin. We do not monitor his Coumadin. His INR levels are monitored by Dr. Dagmar Hait.  Advised to call his pharmacy and have them call Dr. Dagmar Hait.  He verbalizes understanding and will call his pharmacy.

## 2015-01-26 NOTE — Patient Instructions (Signed)
Medication Instructions: - no changes  Labwork: - none  Procedures/Testing: - none  Follow-Up: - Your physician wants you to follow-up in: 1 year with Dr. Klein You will receive a reminder letter in the mail two months in advance. If you don't receive a letter, please call our office to schedule the follow-up appointment.  Any Additional Special Instructions Will Be Listed Below (If Applicable).   

## 2015-01-26 NOTE — Progress Notes (Signed)
Patient Care Team: Prince Solian, MD as PCP - General   HPI  [name] is a 74 y.o. male Seen in followup   for a nonSTEMI related to a late presentation of an out-of-hospital MI 2012.  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 It was felt that he had completed his inferior MI and was managed medically. 2-D echo 04/26/11: EF 40-45%, inferoseptal akinesis and inferior hypokinesis, mild LAE   Echo EF 7/15 EF 35-40%     Past Medical History  Diagnosis Date  . COPD (chronic obstructive pulmonary disease)   . Coronary artery disease     nstemi 10/2007 - LAD 95p (stented w/ 2.5 x 27mm Promus DES) , D2 99 small, RCA 67mid. - enrolled in Tracer Study/// 04/25/2011-[facility] MI - Occluded RCA, LAD stent patent.  Medical Rx.  . Gout   . Hyperlipidemia   . Tobacco abuse     Cigarettes x 30 yrs; now occasional cigars.  . Hypertension   . Obesity   . Diverticulosis   . Asthma   . GERD (gastroesophageal reflux disease)   . Hemorrhoids   . Obstructive sleep apnea   . Osteoarthritis   . Mobitz type I Wenckebach atrioventricular block     04/2011 after inferior MI  . Ischemic cardiomyopathy     2-D echo 04/26/11: EF 40-45%, inferoseptal akinesis and inferior hypokinesis, mild LAE  . Chronic systolic heart failure   . Atrial flutter     coumadin initiated 05/30/11 - to be followed by PCP    Past Surgical History  Procedure Laterality Date  . Appendectomy    . Tonsillectomy    . Knee arthroscopy      Right  . Left heart catheterization with coronary angiogram N/A 04/25/2011    Procedure: LEFT HEART CATHETERIZATION WITH CORONARY ANGIOGRAM;  Surgeon: Peter M Martinique, MD;   Location: [facility] CATH LAB;  Service: Cardiovascular;  Laterality: N/A;    Current Outpatient Prescriptions  Medication Sig Dispense Refill  . albuterol (PROVENTIL HFA;VENTOLIN HFA) 108 (90 BASE) MCG/ACT inhaler Inhale 1 puff into the lungs every 6 (six) hours as needed. For shortness of breath.     . budesonide-formoterol (SYMBICORT) 160-4.5 MCG/ACT inhaler Inhale 2 puffs into the lungs 2 (two) times daily.      . carvedilol (COREG) 3.125 MG tablet TAKE 1 TABLET BY MOUTH TWICE DAILY WITH A MEAL 60 tablet 0  . Cholecalciferol (VITAMIN D) 2000 UNITS CAPS Take 1 capsule by mouth daily.      . Coenzyme Q10 (CO Q-10) 75 MG CAPS Take 1 capsule by mouth daily.      . Multiple Vitamins-Minerals (MULTIVITAMINS THER. W/MINERALS) TABS Take 1 tablet by mouth daily.      . nitroGLYCERIN (NITROSTAT) 0.4 MG SL tablet Place 1 tablet (0.4 mg total) under the tongue every 5 (five) minutes as needed for chest pain. 25 tablet 3  . warfarin (COUMADIN) 5 MG tablet Take as directed 45 tablet 3   No current facility-administered medications for this visit.    Allergies  Allergen Reactions  . Tiotropium Bromide Monohydrate     REACTION: constipation    Review  of Systems negative except from HPI and PMH  Physical Exam BP 134/96 mmHg  Pulse 77  Ht 6' (1.829 m)  Wt 255 lb 3.2 oz (115.758 kg)  BMI 34.60 kg/m2 Well developed and nourished in no acute distress HENT normal Neck supple with JVP-flat Clear Regular rate and rhythm, no murmurs or gallops Abd-soft with active BS No Clubbing cyanosis 2+edema Skin-warm and dry A & Oriented  Grossly normal sensory and motor function venous discoloration    ECG demonstrates sinus rhythm at 77 intervals 21/12/39 excellent axis leftward at +9 Prior inferior wall MI Poor R-wave progression  Assessment and  Plan  Ischemic cardiomyopathy  hypertension  Atrial fibrillation   the patient is currently taking warfarin. We discussed the potential value of a NOAC. He  will followup with the [facility] when he sees him in the fall to discuss using one as an alternative to his warfarin  His LV function remains impaired  He iwll continue betablockers Renal function has been stable and ACE Rx should be initiated

## 2015-09-17 ENCOUNTER — Emergency Department (HOSPITAL_COMMUNITY): Payer: Medicare Other

## 2015-09-17 ENCOUNTER — Inpatient Hospital Stay (HOSPITAL_COMMUNITY): Payer: Medicare Other

## 2015-09-17 ENCOUNTER — Encounter (HOSPITAL_COMMUNITY): Payer: Self-pay | Admitting: Cardiology

## 2015-09-17 DIAGNOSIS — I25110 Atherosclerotic heart disease of native coronary artery with unstable angina pectoris: Secondary | ICD-10-CM | POA: Diagnosis not present

## 2015-09-17 DIAGNOSIS — I255 Ischemic cardiomyopathy: Secondary | ICD-10-CM

## 2015-09-17 DIAGNOSIS — K219 Gastro-esophageal reflux disease without esophagitis: Secondary | ICD-10-CM | POA: Diagnosis present

## 2015-09-17 DIAGNOSIS — Z955 Presence of coronary angioplasty implant and graft: Secondary | ICD-10-CM

## 2015-09-17 DIAGNOSIS — R0789 Other chest pain: Secondary | ICD-10-CM | POA: Diagnosis present

## 2015-09-17 DIAGNOSIS — J45909 Unspecified asthma, uncomplicated: Secondary | ICD-10-CM | POA: Diagnosis present

## 2015-09-17 DIAGNOSIS — I4892 Unspecified atrial flutter: Secondary | ICD-10-CM | POA: Diagnosis present

## 2015-09-17 DIAGNOSIS — I214 Non-ST elevation (NSTEMI) myocardial infarction: Principal | ICD-10-CM | POA: Diagnosis present

## 2015-09-17 DIAGNOSIS — I200 Unstable angina: Secondary | ICD-10-CM | POA: Diagnosis present

## 2015-09-17 DIAGNOSIS — I252 Old myocardial infarction: Secondary | ICD-10-CM | POA: Diagnosis not present

## 2015-09-17 DIAGNOSIS — E7800 Pure hypercholesterolemia, unspecified: Secondary | ICD-10-CM | POA: Diagnosis present

## 2015-09-17 DIAGNOSIS — Z8249 Family history of ischemic heart disease and other diseases of the circulatory system: Secondary | ICD-10-CM

## 2015-09-17 DIAGNOSIS — E785 Hyperlipidemia, unspecified: Secondary | ICD-10-CM | POA: Diagnosis present

## 2015-09-17 DIAGNOSIS — I469 Cardiac arrest, cause unspecified: Secondary | ICD-10-CM | POA: Diagnosis present

## 2015-09-17 DIAGNOSIS — I2510 Atherosclerotic heart disease of native coronary artery without angina pectoris: Secondary | ICD-10-CM | POA: Diagnosis present

## 2015-09-17 DIAGNOSIS — J449 Chronic obstructive pulmonary disease, unspecified: Secondary | ICD-10-CM | POA: Diagnosis present

## 2015-09-17 DIAGNOSIS — G4733 Obstructive sleep apnea (adult) (pediatric): Secondary | ICD-10-CM | POA: Diagnosis present

## 2015-09-17 DIAGNOSIS — I5022 Chronic systolic (congestive) heart failure: Secondary | ICD-10-CM | POA: Diagnosis present

## 2015-09-17 DIAGNOSIS — Z7901 Long term (current) use of anticoagulants: Secondary | ICD-10-CM | POA: Diagnosis not present

## 2015-09-17 DIAGNOSIS — I447 Left bundle-branch block, unspecified: Secondary | ICD-10-CM | POA: Diagnosis present

## 2015-09-17 DIAGNOSIS — I110 Hypertensive heart disease with heart failure: Secondary | ICD-10-CM | POA: Diagnosis present

## 2015-09-17 DIAGNOSIS — M109 Gout, unspecified: Secondary | ICD-10-CM | POA: Diagnosis present

## 2015-09-17 DIAGNOSIS — F17290 Nicotine dependence, other tobacco product, uncomplicated: Secondary | ICD-10-CM | POA: Diagnosis present

## 2015-09-17 HISTORY — DX: Essential (primary) hypertension: I10

## 2015-09-17 HISTORY — DX: Non-ST elevation (NSTEMI) myocardial infarction: I21.4

## 2015-09-17 LAB — PROTIME-INR
INR: 2.21 — ABNORMAL HIGH (ref 0.00–1.49)
Prothrombin Time: 24.3 seconds — ABNORMAL HIGH (ref 11.6–15.2)

## 2015-09-17 LAB — CBC
HCT: 49.1 % (ref 39.0–52.0)
HEMOGLOBIN: 16.6 g/dL (ref 13.0–17.0)
MCH: 32 pg (ref 26.0–34.0)
MCHC: 33.8 g/dL (ref 30.0–36.0)
MCV: 94.8 fL (ref 78.0–100.0)
Platelets: 204 10*3/uL (ref 150–400)
RBC: 5.18 MIL/uL (ref 4.22–5.81)
RDW: 13.8 % (ref 11.5–15.5)
WBC: 11.7 10*3/uL — ABNORMAL HIGH (ref 4.0–10.5)

## 2015-09-17 LAB — BASIC METABOLIC PANEL
ANION GAP: 9 (ref 5–15)
BUN: 13 mg/dL (ref 6–20)
CHLORIDE: 98 mmol/L — AB (ref 101–111)
CO2: 29 mmol/L (ref 22–32)
Calcium: 9.1 mg/dL (ref 8.9–10.3)
Creatinine, Ser: 0.72 mg/dL (ref 0.61–1.24)
GFR calc non Af Amer: >60 mL/min (ref >60)
Glucose, Bld: 124 mg/dL — ABNORMAL HIGH (ref 65–99)
Potassium: 3.9 mmol/L (ref 3.5–5.1)
Sodium: 136 mmol/L (ref 135–145)

## 2015-09-17 LAB — TROPONIN I
TROPONIN I: 0.12 ng/mL — AB (ref <0.031)
TROPONIN I: 8.19 ng/mL — AB (ref <0.031)
Troponin I: 0.65 ng/mL (ref <0.031)

## 2015-09-17 MED ORDER — HEPARIN (PORCINE) IN NACL 100-0.45 UNIT/ML-% IJ SOLN
1000.0000 [IU]/h | INTRAMUSCULAR | Status: DC
  Administered 2015-09-17: 1000 [IU]/h via INTRAVENOUS
  Filled 2015-09-17: qty 250

## 2015-09-17 MED ORDER — ASPIRIN 81 MG PO CHEW
324.0000 mg | CHEWABLE_TABLET | ORAL | Status: AC
  Administered 2015-09-17: 324 mg via ORAL
  Filled 2015-09-17: qty 4

## 2015-09-17 MED ORDER — PANTOPRAZOLE SODIUM 40 MG PO TBEC
40.0000 mg | DELAYED_RELEASE_TABLET | Freq: Every day | ORAL | Status: DC
  Administered 2015-09-17: 40 mg via ORAL
  Filled 2015-09-17: qty 1

## 2015-09-17 MED ORDER — ATORVASTATIN CALCIUM 40 MG PO TABS
80.0000 mg | ORAL_TABLET | Freq: Every day | ORAL | Status: DC
  Filled 2015-09-17 (×3): qty 2

## 2015-09-17 MED ORDER — ASPIRIN 300 MG RE SUPP: 300.0000 mg | RECTAL | Status: AC

## 2015-09-17 MED ORDER — ASPIRIN EC 81 MG PO TBEC: 81.0000 mg | DELAYED_RELEASE_TABLET | Freq: Every day | ORAL | Status: DC

## 2015-09-17 MED ORDER — FOLIC ACID 1 MG PO TABS
1.0000 mg | ORAL_TABLET | Freq: Every day | ORAL | Status: DC
  Administered 2015-09-17: 1 mg via ORAL
  Filled 2015-09-17: qty 1

## 2015-09-17 MED ORDER — NITROGLYCERIN 0.4 MG SL SUBL: 0.4000 mg | SUBLINGUAL_TABLET | SUBLINGUAL | Status: DC | PRN

## 2015-09-17 MED ORDER — ONDANSETRON HCL 4 MG/2ML IJ SOLN: 4.0000 mg | Freq: Four times a day (QID) | INTRAMUSCULAR | Status: DC | PRN

## 2015-09-17 MED ORDER — AMINOCAPROIC ACID 500 MG PO TABS
500.0000 mg | ORAL_TABLET | Freq: Every day | ORAL | Status: DC
  Administered 2015-09-17: 500 mg via ORAL
  Filled 2015-09-17 (×5): qty 1

## 2015-09-17 MED ORDER — ACETAMINOPHEN 325 MG PO TABS
650.0000 mg | ORAL_TABLET | ORAL | Status: DC | PRN
Start: 2015-09-17 — End: 2015-09-17

## 2015-09-17 MED ORDER — NITROGLYCERIN 0.4 MG SL SUBL
0.4000 mg | SUBLINGUAL_TABLET | SUBLINGUAL | Status: DC | PRN
  Administered 2015-09-17: 0.4 mg via SUBLINGUAL
  Filled 2015-09-17: qty 1

## 2015-09-17 MED ORDER — NITROGLYCERIN 2 % TD OINT
0.5000 [in_us] | TOPICAL_OINTMENT | Freq: Four times a day (QID) | TRANSDERMAL | Status: DC
  Administered 2015-09-17: 0.5 [in_us] via TOPICAL
  Filled 2015-09-17: qty 1

## 2015-09-17 MED ORDER — CARVEDILOL 3.125 MG PO TABS
3.1250 mg | ORAL_TABLET | Freq: Two times a day (BID) | ORAL | Status: DC
  Filled 2015-09-17 (×8): qty 1

## 2015-09-17 MED ORDER — ALBUTEROL SULFATE HFA 108 (90 BASE) MCG/ACT IN AERS: 2.0000 | INHALATION_SPRAY | Freq: Four times a day (QID) | RESPIRATORY_TRACT | Status: DC

## 2015-09-17 MED ORDER — TRAMADOL HCL 50 MG PO TABS
50.0000 mg | ORAL_TABLET | Freq: Four times a day (QID) | ORAL | Status: DC
Start: 2015-09-17 — End: 2015-09-17

## 2015-09-17 MED ORDER — ASPIRIN 81 MG PO CHEW
324.0000 mg | CHEWABLE_TABLET | Freq: Once | ORAL | Status: AC
Start: 2015-09-17 — End: 2015-09-17
  Administered 2015-09-17: 324 mg via ORAL
  Filled 2015-09-17: qty 4

## 2015-09-20 SURGERY — LEFT HEART CATH AND CORONARY ANGIOGRAPHY

## 2015-09-20 MED FILL — Medication: Qty: 2 | Status: AC

## 2015-10-14 NOTE — ED Notes (Signed)
Changed patient's bed to a hospital bed for better comfort. Per bed control, there are no step-down beds available at [facility].

## 2015-10-14 NOTE — ED Notes (Signed)
At approximately 1605 this nurse was notified by patient's spouse that patient needed to use the bathroom. Noted patient attempting to get out of bed on his own. Placed patient's shoes on and followed patient to the bathroom pushing IV pole with heparin infusing at that time. Patient found at approximately 1610 face down over top of the toilet with head against the hand rail. Code called, patient laid flat in floor with no pulse noted and CPR started. EDP at patient's side. Assisted patient to backboard and moved to bed to continue treatment.

## 2015-10-14 NOTE — ED Notes (Signed)
Time of death and CPR stopped at 1637. Verified no cardia activity by ultrasound by Dr. Jeanell Sparrow at this time and EDP reported pupils are fixed and dilated.

## 2015-10-14 NOTE — ED Notes (Signed)
CRITICAL VALUE ALERT  Critical value received:  Troponin 0.65   Date of notification:  10/16/2015  Time of notification:  [id number]  Critical value read back:Yes.    Nurse who received alert:  RMinter, RN  MD notified (1st page):  Dr. Domenic Polite   Time of first page:  41  MD notified (2nd page):  Time of second page:  Responding MD:  Dr. Domenic Polite  Time MD responded:  1102 by Amnion

## 2015-10-14 NOTE — ED Notes (Signed)
EKG leads placed in pt chart.

## 2015-10-14 NOTE — ED Notes (Signed)
Atropine 1mg  given IV at this time.

## 2015-10-14 NOTE — ED Notes (Signed)
Pt brought down to morgue with security, richard and deanna, nt.

## 2015-10-14 NOTE — ED Notes (Signed)
PT became unresponsive in the restroom at this time and EDP made aware with no pulses noted and chest compressions began at this time. PT cyanotic at this time. With PEA rate of 38 noted on the monitor at 1612.

## 2015-10-14 NOTE — ED Notes (Signed)
EPI 1mg given at this time. 

## 2015-10-14 NOTE — ED Notes (Signed)
120J defib delivered at this time.

## 2015-10-14 NOTE — ED Notes (Signed)
Epi 1mg  given IV at this time.

## 2015-10-14 NOTE — ED Provider Notes (Addendum)
Code called by nurse.  Patient found down in bathroom.  Patient being admitted for nstemi.  Patient pulseless and apneic on initial evaluation.    Patient resuscitated per acls protocol.  Initial rhythm asystole.  Patient with one episode of ?vfib and defibrillated.  Patient intubated. No response to cpr.  Remained asystolic despite resuscitation.  Pupils fixed and dilated, no cardiac activity on [place].  CPR discontinued at 1734.  Please see nursing note for full cpr record.  Discussed with wife and son.    Pattricia Boss, MD 2015-10-01 [phone]  RN spoke with Dr. [place] Casco and death certificate to be signed by Dr. Dagmar Hait.  Pattricia Boss, MD 10/01/15 [phone]

## 2015-10-14 NOTE — ED Provider Notes (Signed)
CSN: [id number]     Arrival date & time Oct 08, 2015  [id number] History  By signing my name below, I, Eustaquio Maize, attest that this documentation has been prepared under the direction and in the presence of Ripley Fraise, MD. Electronically Signed: Eustaquio Maize, ED Scribe. 08-Oct-2015. 8:19 AM.   Chief Complaint  Patient presents with  . Pleurisy   Patient is a 75 y.o. male presenting with chest pain. The history is provided by the patient. No language interpreter was used.  Chest Pain Pain location:  Substernal area Pain radiates to:  Does not radiate Pain radiates to the back: no   Pain severity:  Severe Onset quality:  Sudden Duration:  5 hours Timing:  Constant Progression:  Partially resolved Chronicity:  New Relieved by:  Nitroglycerin Associated symptoms: diaphoresis and weakness (generalized)   Associated symptoms: no abdominal pain, no back pain, no cough, no dizziness, no nausea, no shortness of breath and not vomiting   Risk factors: coronary artery disease, high cholesterol, hypertension, male sex and smoking      HPI Comments: [name] is a 75 y.o. male with PMHx COPD not on home O2, CAD, MI (x2) HTN, HLD, chronic systolic heart failure, and atrial flutter who presents to the Emergency Department complaining of sudden onset, constant, non-radiating, severe, substernal chest pain that began at 3 AM (approximately 5 hours ago). Pt reports that he woke up to use the rest room when he began having the chest pain. Pt states that he felt fine when going to bed last night. Pt attributed the pain to indigestion at first. There were no exacerbating factors the pain. He also complains of diaphoresis and generalized weakness with the chest pain. Pt took 2 sublingual NTG with mild relief. He has not had pain like this recently. He mentions having bilateral lower extremity swelling last night. Denies shortness of breath, dizziness, nausea, vomiting, abdominal pain, back pain, cough,  hemoptysis, or any other associated symptoms. He is currently on Coumadin. Pt is current some day smoker who smokes cigars.   Past Medical History  Diagnosis Date  . COPD (chronic obstructive pulmonary disease) ([place])   . Coronary artery disease     nstemi 10/2007 - LAD 95p (stented w/ 2.5 x 87mm Promus DES) , D2 99 small, RCA 23mid. - enrolled in Tracer Study/// 04/25/2011-[facility] MI - Occluded RCA, LAD stent patent.  Medical Rx.  . Gout   . Hyperlipidemia   . Tobacco abuse     Cigarettes x 30 yrs; now occasional cigars.  . Hypertension   . Obesity   . Diverticulosis   . Asthma   . GERD (gastroesophageal reflux disease)   . Hemorrhoids   . Obstructive sleep apnea   . Osteoarthritis   . Mobitz type I Wenckebach atrioventricular block     04/2011 after inferior MI  . Ischemic cardiomyopathy     2-D echo 04/26/11: EF 40-45%, inferoseptal akinesis and inferior hypokinesis, mild LAE  . Chronic systolic heart failure ([place])   . Atrial flutter ([place])     coumadin initiated 05/30/11 - to be followed by PCP   Past Surgical History  Procedure Laterality Date  . Appendectomy    . Tonsillectomy    . Knee arthroscopy      Right  . Left heart catheterization with coronary angiogram N/A 04/25/2011    Procedure: LEFT HEART CATHETERIZATION WITH CORONARY ANGIOGRAM;  Surgeon: Peter M Martinique, MD;  Location: [facility] CATH LAB;  Service: Cardiovascular;  Laterality: N/A;  Family History  Problem Relation Age of Onset  . Heart attack Father     died @ 92  . Heart failure Father    Social History  Substance Use Topics  . Smoking status: Current Some Day Smoker -- 30 years    Types: Cigars  . Smokeless tobacco: None     Comment: pt has 30 pack yr history of cigarette use; now smoking occasional cigar.  . Alcohol Use: 0.5 oz/week    1 drink(s) per week    Review of Systems  Constitutional: Positive for diaphoresis.  Respiratory: Negative for cough and shortness of breath.   Cardiovascular: Positive  for chest pain and leg swelling.  Gastrointestinal: Negative for nausea, vomiting and abdominal pain.  Musculoskeletal: Negative for back pain.  Neurological: Positive for weakness (generalized). Negative for dizziness.  All other systems reviewed and are negative.  Allergies  Tiotropium bromide monohydrate  Home Medications   Prior to Admission medications   Medication Sig Start Date End Date Taking? Authorizing Provider  albuterol (PROVENTIL HFA;VENTOLIN HFA) 108 (90 BASE) MCG/ACT inhaler Inhale 1 puff into the lungs every 6 (six) hours as needed. For shortness of breath.     Historical Provider, MD  budesonide-formoterol (SYMBICORT) 160-4.5 MCG/ACT inhaler Inhale 2 puffs into the lungs 2 (two) times daily.      Historical Provider, MD  carvedilol (COREG) 3.125 MG tablet Take 1 tablet (3.125 mg total) by mouth 2 (two) times daily with a meal. 01/26/15   Deboraha Sprang, MD  Cholecalciferol (VITAMIN D) 2000 UNITS CAPS Take 1 capsule by mouth daily.      Historical Provider, MD  Coenzyme Q10 (CO Q-10) 75 MG CAPS Take 1 capsule by mouth daily.      Historical Provider, MD  Multiple Vitamins-Minerals (MULTIVITAMINS THER. W/MINERALS) TABS Take 1 tablet by mouth daily.      Historical Provider, MD  nitroGLYCERIN (NITROSTAT) 0.4 MG SL tablet Place 1 tablet (0.4 mg total) under the tongue every 5 (five) minutes as needed for chest pain. 01/26/15 10/23/18  Deboraha Sprang, MD  warfarin (COUMADIN) 5 MG tablet Take as directed 06/28/11   Deboraha Sprang, MD   BP 149/97 mmHg  Pulse 65  Temp(Src) 97.6 F (36.4 C) (Oral)  Resp 20  Ht 6\' 1"  (1.854 m)  Wt 250 lb (113.399 kg)  BMI 32.99 kg/m2  SpO2 90%   Physical Exam  Nursing note and vitals reviewed.   CONSTITUTIONAL: Well developed/well nourished HEAD: Normocephalic/atraumatic EYES: EOMI ENMT: Mucous membranes moist NECK: supple no meningeal signs SPINE/BACK:entire spine nontender CV: S1/S2 noted, no murmurs/rubs/gallops noted LUNGS: Basilar  crackles noted with scattered wheezes ABDOMEN: soft, nontender, no rebound or guarding, bowel sounds noted throughout abdomen GU:no cva tenderness NEURO: Pt is awake/alert/appropriate, moves all extremitiesx4.  No facial droop.   EXTREMITIES: pulses normal/equal x4, full ROM, minimal symmetric pitting edema SKIN: warm, color normal PSYCH: no abnormalities of mood noted, alert and oriented to situation  ED Course  Procedures  CRITICAL CARE Performed by: Sharyon Cable Total critical care time: 35 minutes Critical care time was exclusive of separately billable procedures and treating other patients. Critical care was necessary to treat or prevent imminent or life-threatening deterioration. Critical care was time spent personally by me on the following activities: development of treatment plan with patient and/or surrogate as well as nursing, discussions with consultants, evaluation of patient's response to treatment, examination of patient, obtaining history from patient or surrogate, ordering and performing treatments and interventions, ordering  and review of laboratory studies, ordering and review of radiographic studies, pulse oximetry and re-evaluation of patient's condition. PATIENT WITH NON-STEMI, TROPONIN ELEVATED, REQUIRES TRANSFER TO CARDIAC CENTER  DIAGNOSTIC STUDIES: Oxygen Saturation is 90% on [place], low by my interpretation.    COORDINATION OF CARE: 8:17 AM-Discussed treatment plan which includes CXR with pt at bedside and pt agreed to plan.  9:19 AM Pt with h/o CAD, presents with CP His EKG does NOT reveal STEMI Repeat EKG did show some ST depression His troponin is elevated His CP is improved He is resting comfortably He will need admission/transfer to [place] D/w dr Domenic Polite with cardiology His team will evaluate patient and likely transfer to cardiac cath at [facility] Heparin deferred as he is on coumadin NTG/ASA given to patient Labs Review Labs Reviewed   BASIC METABOLIC PANEL - Abnormal; Notable for the following:    Chloride 98 (*)    Glucose, Bld 124 (*)    All other components within normal limits  CBC - Abnormal; Notable for the following:    WBC 11.7 (*)    All other components within normal limits  TROPONIN I - Abnormal; Notable for the following:    Troponin I 0.12 (*)    All other components within normal limits  PROTIME-INR - Abnormal; Notable for the following:    Prothrombin Time 24.3 (*)    INR 2.21 (*)    All other components within normal limits    Imaging Review Dg Chest 2 View  Sep 27, 2015  CLINICAL DATA:  Awakened with excruciating chest pain today common no other symptoms currently; history of asthma -[place] OPD, atrial fibrillation, Coronary artery disease, CHF, current smoker. EXAM: CHEST  2 VIEW COMPARISON:  PA and lateral chest x-ray of April 26, 2011 and CT scan chest of April 30, 2013. FINDINGS: The lungs are well-expanded. The interstitial markings are coarse though stable. There are linear increased densities in the lingula likely reflecting atelectasis. The cardiac silhouette is top-normal in size. The pulmonary vascularity is not engorged. The mediastinum is normal in width. The trachea is midline. There is multilevel degenerative disc disease of the thoracic spine. IMPRESSION: 1. No definite evidence of CHF. 2. Lingular atelectasis or early pneumonia.  Underlying COPD. Electronically Signed   By: David  Martinique M.D.   On: Sep 27, 2015 08:34   I have personally reviewed and evaluated these images and lab results as part of my medical decision-making.   EKG Interpretation   Date/Time:  09-27-15 07:58:37 EDT Ventricular Rate:  64 PR Interval:  204 QRS Duration: 156 QT Interval:  446 QTC Calculation: 460 R Axis:   -39 Text Interpretation:  Sinus rhythm Nonspecific IVCD with LAD Inferior  infarct, age indeterminate Abnormal lateral Q waves Anterior infarct, old  Baseline wander in lead(s) I III  aVL V3 V5 Abnormal ekg Confirmed by  Christy Gentles  MD, Clemence Lengyel ([id number]) on 09/27/15 8:04:04 AM     Medications  nitroGLYCERIN (NITROSTAT) SL tablet 0.4 mg (0.4 mg Sublingual Given 27-Sep-2015 0840)  aspirin chewable tablet 324 mg (324 mg Oral Given 09-27-15 0839)    EKG Interpretation  Date/Time:  2015/09/27 08:25:28 EDT Ventricular Rate:  62 PR Interval:  208 QRS Duration: 153 QT Interval:  500 QTC Calculation: 508 R Axis:   -32 Text Interpretation:  Sinus rhythm Atrial premature complex Left bundle branch block Abnormal ekg Confirmed by Christy Gentles  MD, Evey Mcmahan ([id number]) on 2015/09/27 8:43:15 AM       MDM  Final diagnoses:  Non-STEMI (non-ST elevated myocardial infarction) [facility])    Nursing notes including past medical history and social history reviewed and considered in documentation xrays/imaging reviewed by myself and considered during evaluation Labs/vital reviewed myself and considered during evaluation   I personally performed the services described in this documentation, which was scribed in my presence. The recorded information has been reviewed and is accurate.       Ripley Fraise, MD Sep 20, 2015 [phone]

## 2015-10-14 NOTE — ED Notes (Signed)
Woke up at 4 am with chest pain.  Took 2 sl ntg at home and pain is now better 1/10.

## 2015-10-14 NOTE — ED Notes (Signed)
Gave patient meal tray as ordered.

## 2015-10-14 NOTE — Progress Notes (Signed)
ANTICOAGULATION CONSULT NOTE - Initial Consult  Pharmacy Consult for HEPARIN Indication: chest pain/ACS  Allergies  Allergen Reactions  . Tiotropium Bromide Monohydrate     REACTION: constipation   Patient Measurements: Height: 6\' 1"  (185.4 cm) Weight: 250 lb (113.399 kg) IBW/kg (Calculated) : 79.9  Vital Signs: Temp: 97.6 F (36.4 C) (05/05 0800) Temp Source: Oral (05/05 0800) BP: 128/73 mmHg (05/05 0930) Pulse Rate: 56 (05/05 0930)  Labs:  Recent Labs  10-05-15 0800  HGB 16.6  HCT 49.1  PLT 204  LABPROT 24.3*  INR 2.21*  CREATININE 0.72  TROPONINI 0.12*    Estimated Creatinine Clearance: 103.7 mL/min (by C-G formula based on Cr of 0.72).   Medical History: Past Medical History  Diagnosis Date  . COPD (chronic obstructive pulmonary disease) ([place])   . Coronary artery disease     June 2009 LAD 95p (stented w/ 2.5 x 74mm Promus DES), D2 99 small, RCA 70 mid - enrolled in Tracer Study; December 2012 OOH IMI - occluded RCA, LAD stent patent.  Medical Rx.  . Gout   . Hyperlipidemia   . NSTEMI (non-ST elevated myocardial infarction) ([place]) 2009  . Essential hypertension   . Obesity   . Diverticulosis   . Asthma   . GERD (gastroesophageal reflux disease)   . Hemorrhoids   . Obstructive sleep apnea   . Osteoarthritis   . Mobitz type I Wenckebach atrioventricular block     04/2011 after inferior MI  . Ischemic cardiomyopathy     LVEF 35-40%  . Chronic systolic heart failure ([place])   . Atrial flutter ([place])     Coumadin initiated 05/30/11 - to be followed by PCP   Medications:   (Not in a hospital admission)  Assessment: 75yo male presents to ED, pt has h/o MI, CAD and is on chronic Coumadin PTA.  INR at therapeutic level on presentation.  Asked to initiate Heparin for ACS.   Goal of Therapy:  Heparin level 0.3-0.7 units/ml Monitor platelets by anticoagulation protocol: Yes   Plan:   Heparin infusion at 1000 units/hr (no bolus due to INR > 2)  Heparin  level in 6-8 hrs then daily  CBC daily while on Heparin  Monitor for s/sx of bleeding complications  [place] Crane, Korby Ratay A 10-05-15,10:30 AM

## 2015-10-14 NOTE — ED Notes (Signed)
X-ray at bedside at this time.

## 2015-10-14 NOTE — ED Notes (Signed)
ET tube placed at this time 8.0 and securred 23 at the lip.

## 2015-10-14 NOTE — ED Notes (Signed)
Family at bedside. 

## 2015-10-14 NOTE — H&P (Signed)
CARDIOLOGY HISTORY AND PHYSICAL   Patient ID: [name] MRN: [id number]  DOB/AGE: March 22, 1941 75 y.o. Admit date: 10/11/2015  Primary Care Physician: Tivis Ringer, MD Primary Cardiologist: Jolyn Nap MD (also Dr. Johnsie Cancel)  Clinical Summary [name] is a 75 y.o.male with known history of coronary artery disease s/p DES to LAD 2009 with NSTEMI and late presentation IMI 2012 with occluded RCA managed medically, ischemic cardiomyopathy with LVEF 35-40%, ongoing tobacco abuse with cigars 3-5 a day, hyperlipidemia, hypertension, and chronic systolic heart failure.  He presented to the emergency room after awakening around 3:30 AM to use the bathroom. He noticed that he began substernal heartburn-like symptoms with ache nonradiating to his neck and jaw or arms, associated diaphoresis was prominent and concerning, no dizziness, dyspnea, or weakness. He currently states that he feels fatigued. He took some Tums and drank 3 glasses of water. He had no relief, and took one sublingual nitroglycerin and woke his wife around 7 AM. Due to his symptoms she urged him to come to ER.  On arrival in emergency room the patient's blood pressure was 157/95 pulse 66 O2 sat 91% afebrile. Pertinent labs revealed glucose of 124. Creatinine 0.72. INR 2.21. White blood cells were elevated at 11.7. Troponin 0.12. Chest x-ray revealed no definite evidence of CHF, underlying COPD with lingular atelectasis or early pneumonia. EKG revealed sinus rhythm with left bundle branch block, and PACs. Compared to EKG in September 2016, first-degree AV block has resolved, intraventricular conduction delay remains. He was given 324 mg aspirin, and a sublingual nitroglycerin tablet.   The patient still complains of some vague burning substernal, but not as severe as when it first began around 3:30 AM. On further prompting, the patient states that prior to his stent he had similar symptoms which he felt was related to  heartburn as well.   Allergies  Allergen Reactions  . Tiotropium Bromide Monohydrate     REACTION: constipation    Home Medications No current facility-administered medications on file prior to encounter.   Current Outpatient Prescriptions on File Prior to Encounter  Medication Sig Dispense Refill  . albuterol (PROVENTIL HFA;VENTOLIN HFA) 108 (90 BASE) MCG/ACT inhaler Inhale 2 puffs into the lungs every 6 (six) hours as needed for shortness of breath. For shortness of breath.    . carvedilol (COREG) 3.125 MG tablet Take 1 tablet (3.125 mg total) by mouth 2 (two) times daily with a meal. 180 tablet 3  . Cholecalciferol (VITAMIN D) 2000 UNITS CAPS Take 1 capsule by mouth daily.      . Coenzyme Q10 (CO Q-10) 75 MG CAPS Take 1 capsule by mouth daily.      . nitroGLYCERIN (NITROSTAT) 0.4 MG SL tablet Place 1 tablet (0.4 mg total) under the tongue every 5 (five) minutes as needed for chest pain. 25 tablet 3  . warfarin (COUMADIN) 5 MG tablet Take as directed (Patient taking differently: Take 10 mg by mouth. Take as directed) 45 tablet 3  . Multiple Vitamins-Minerals (MULTIVITAMINS THER. W/MINERALS) TABS Take 1 tablet by mouth daily.        Past Medical History  Diagnosis Date  . COPD (chronic obstructive pulmonary disease) ([place])   . Coronary artery disease     June 2009 LAD 95p (stented w/ 2.5 x 69mm Promus DES), D2 99 small, RCA 70 mid - enrolled in Tracer Study; December 2012 OOH IMI - occluded RCA, LAD stent patent.  Medical Rx.  . Gout   . Hyperlipidemia   .  NSTEMI (non-ST elevated myocardial infarction) ([place]) 2009  . Essential hypertension   . Obesity   . Diverticulosis   . Asthma   . GERD (gastroesophageal reflux disease)   . Hemorrhoids   . Obstructive sleep apnea   . Osteoarthritis   . Mobitz type I Wenckebach atrioventricular block     04/2011 after inferior MI  . Ischemic cardiomyopathy     LVEF 35-40%  . Chronic systolic heart failure ([place])   . Atrial flutter ([place])      Coumadin initiated 05/30/11 - to be followed by PCP    Past Surgical History  Procedure Laterality Date  . Appendectomy    . Tonsillectomy    . Knee arthroscopy Right   . Left heart catheterization with coronary angiogram N/A 04/25/2011    Procedure: LEFT HEART CATHETERIZATION WITH CORONARY ANGIOGRAM;  Surgeon: Peter M Martinique, MD;  Location: [facility] CATH LAB;  Service: Cardiovascular;  Laterality: N/A;    Family History  Problem Relation Age of Onset  . Heart attack Father     Died @ 44  . Heart failure Father     Social History [name] reports that he has been smoking Cigars.  He does not have any smokeless tobacco history on file. [name] reports that he drinks about 0.5 oz of alcohol per week.  Review of Systems Otherwise reviewed and negative except as outlined. Decreased hearing. Arthritic pains. No recent melena or hematochezia. No cough, fevers or chills.  Physical Examination Temp:  [97.6 F (36.4 C)] 97.6 F (36.4 C) (05/05 0800) Pulse Rate:  [54-66] 56 (05/05 0930) Resp:  [18-22] 20 (05/05 0930) BP: (119-157)/(73-97) 128/73 mmHg (05/05 0930) SpO2:  [90 %-97 %] 96 % (05/05 0930) Weight:  [250 lb (113.399 kg)] 250 lb (113.399 kg) (05/05 0801) No intake or output data in the 24 hours ending 10-07-15 1013  Telemetry: Sinus rhythm.  Gen: No acute distress. HEENT: Conjunctiva and lids normal, oropharynx clear. Neck: Supple, no elevated JVP or carotid bruits, no thyromegaly. Lungs: Decreased breath sounds without crackles, nonlabored breathing at rest. Cardiac: Regular rate and rhythm, no S3 or significant systolic murmur, no pericardial rub. Abdomen: Soft, nontender,bowel sounds present, no guarding or rebound. Extremities: No pitting edema, distal pulses 2+. Skin: Warm and dry. Musculoskeletal: No kyphosis. Neuropsychiatric: Alert and oriented x3, affect grossly appropriate.  Lab Results  Basic Metabolic Panel:  Recent Labs Lab 10-07-2015 0800  NA 136   K 3.9  CL 98*  CO2 29  GLUCOSE 124*  BUN 13  CREATININE 0.72  CALCIUM 9.1   CBC:  Recent Labs Lab Oct 07, 2015 0800  WBC 11.7*  HGB 16.6  HCT 49.1  MCV 94.8  PLT 204   Cardiac Enzymes:  Recent Labs Lab Oct 07, 2015 0800  TROPONINI 0.12*   Radiology Dg Chest 2 View  Oct 07, 2015  CLINICAL DATA:  Awakened with excruciating chest pain today common no other symptoms currently; history of asthma -Celsius OPD, atrial fibrillation, Coronary artery disease, CHF, current smoker. EXAM: CHEST  2 VIEW COMPARISON:  PA and lateral chest x-ray of April 26, 2011 and CT scan chest of April 30, 2013. FINDINGS: The lungs are well-expanded. The interstitial markings are coarse though stable. There are linear increased densities in the lingula likely reflecting atelectasis. The cardiac silhouette is top-normal in size. The pulmonary vascularity is not engorged. The mediastinum is normal in width. The trachea is midline. There is multilevel degenerative disc disease of the thoracic spine. IMPRESSION: 1. No definite evidence of CHF.  2. Lingular atelectasis or early pneumonia.  Underlying COPD. Electronically Signed   By: David  Martinique M.D.   On: 2015-10-02 08:34    Prior Cardiac Testing/Procedures:  Cardiac Cath 04/25/2011 Coronary dominance: right Left mainstem: Minor irregularities less than 10%. Left anterior descending (LAD): There is a stent in the proximal vessel which is widely patent. In the mid vessel there is a 40% focal stenosis. Left circumflex (LCx): This is a large vessel giving rise to 2 large marginal branches. There is scattered mild disease up to 20%. Right coronary artery (RCA): This is a dominant vessel. It is abruptly occluded in the proximal vessel. No collateral flow is seen to the right coronary. Left ventriculography: Left ventricular systolic function is abnormal. There is severe diffuse inferior wall hypokinesis. Overall ejection fraction is estimated at 40-45%.  Final  Conclusions:  1. Single vessel occlusive coronary disease with occlusion of the proximal right coronary. Prior stent in the proximal LAD is widely patent. 2. Moderate left and trigger dysfunction with inferior wall motion abnormality.  ECG: Sinus rhythm with old inferior infarct pattern and IVCD, nonspecific ST changes.  Impression and Recommendations  1. ACS/NSTEMI: Patient with known history of coronary artery disease with occluded RCA and DES to the LAD. He awoke around 3:30 this a.m. with complaints of heartburn feeling worsened when he got up to use the bathroom. Not relieved by Tums or nitroglycerin. Presented to the emergency room with first troponin 0.12. Continues to have some vague substernal chest burning although this is improved since admission. Continue to cycle troponin I.  The patient has been seen and examined by myself and Dr. Domenic Polite who recommends that the patient be transferred to [facility] for further management and diagnostic cardiac catheterization. Due to  Coumadin therapy with INR 2.2, the patient will be taken off of Coumadin and will start heparin once INR is subtherapeutic. Will place 1 inch of nitroglycerin paste topically. This is been discussed with the patient and his wife by myself and by Dr. Domenic Polite, with risks and benefits described. He and his wife are agreeable for transfer to [facility] for further diagnostic testing.  2. CAD: Continue carvedilol 3.125 mg twice a day. It is noted that he is not on statin or ACE inhibitor at this time. Will begin statin. Blood pressure is soft with with hold off on ACE for now.  3. Atrial flutter: Continues on Coumadin therapy which will now be held in anticipation of cardiac catheterization once INR has come down appropriately. INR 2.21 today. As stated would not start heparin drip until INR subtherapeutic.  4. COPD: Patient will be continued on albuterol inhalers. May consider nebulizer treatments if breathing status  changes or wheezing is noted. Chest x-ray suggests lingular atelectasis versus early infiltrate, although no cough, fevers or chills. Continue to follow.  5. Ongoing Tobacco abuse: Continues to smoke 3-5 cigars a day but has stopped abusing cigarettes. Cessation recommended.  Signed: Phill Myron. Lawrence NP [place]  Oct 02, 2015, 10:13 AM Co-Sign MD   Attending note:  Patient seen and examined. Reviewed records and updated his chart. Case discussed with Ms. Lawrence NP, I modified the above note. Mr. Derrington presents after waking up early this morning with indigestion-like chest discomfort associated with diaphoresis and weakness. Symptoms did not improve with antacids, he had some improvement with nitroglycerin once evaluated in the ER, but still has mild chest discomfort. Reports similar symptoms around the time of his last cardiac flareup. History includes NSTEMI in 2009 status  post treatment with DES to the LAD at that time and medical management of a small diagonal stenosis and moderate RCA stenosis. He had a late presentation inferior infarct in 2012 with finding of occluded RCA that was managed medically and patent LAD stent site. Also ischemic cardiomyopathy with last LVEF 35-40%.  On examination he is in no distress, heart rate in the 0000000, systolic blood pressure [id number] after nitroglycerin. Lungs exhibit diminished breath sounds but no wheezing or egophony, cardiac exam with RRR and no gallop. Lab work shows troponin I 0.12, WBC 11.7, hemoglobin 16.6, platelets 204, creatinine 0.7, INR 2.2. ECG shows old inferior infarct pattern with IVCD and nonspecific ST changes. He is in sinus rhythm at this time.  Symptoms concerning for ACS/NSTEMI with initial troponin I 0.12. Situation discussed with patient and wife, plan is to transfer to our cardiology service at [facility], Dr. Stanford Breed is accepting. Anticipate diagnostic cardiac catheterization once patient's INR comes down to appropriate level after  holding Coumadin. Consider initiating heparin once INR is subtherapeutic, otherwise we will continue beta blocker, add empiric statin, and use nitroglycerin paste. Patient is in agreement to proceed.  Satira Sark, M.D., F.A.C.C.

## 2015-10-14 NOTE — ED Notes (Signed)
1mg  of EPI given IV at this time.

## 2015-10-14 DEATH — deceased
